# Patient Record
Sex: Male | Born: 1950
Health system: Southern US, Community
[De-identification: ages and names within clinical notes are randomized; demographics above are authoritative.]

## PROBLEM LIST (undated history)

## (undated) DIAGNOSIS — R739 Hyperglycemia, unspecified: Secondary | ICD-10-CM

## (undated) DIAGNOSIS — Z8614 Personal history of Methicillin resistant Staphylococcus aureus infection: Secondary | ICD-10-CM

## (undated) DIAGNOSIS — R7303 Prediabetes: Secondary | ICD-10-CM

## (undated) DIAGNOSIS — H9190 Unspecified hearing loss, unspecified ear: Secondary | ICD-10-CM

## (undated) DIAGNOSIS — A4902 Methicillin resistant Staphylococcus aureus infection, unspecified site: Secondary | ICD-10-CM

## (undated) DIAGNOSIS — N200 Calculus of kidney: Secondary | ICD-10-CM

## (undated) DIAGNOSIS — G4700 Insomnia, unspecified: Secondary | ICD-10-CM

## (undated) DIAGNOSIS — R319 Hematuria, unspecified: Secondary | ICD-10-CM

## (undated) DIAGNOSIS — J920 Pleural plaque with presence of asbestos: Secondary | ICD-10-CM

## (undated) DIAGNOSIS — H409 Unspecified glaucoma: Secondary | ICD-10-CM

## (undated) DIAGNOSIS — I2510 Atherosclerotic heart disease of native coronary artery without angina pectoris: Secondary | ICD-10-CM

## (undated) DIAGNOSIS — N201 Calculus of ureter: Secondary | ICD-10-CM

## (undated) DIAGNOSIS — I7121 Aneurysm of the ascending aorta, without rupture: Secondary | ICD-10-CM

## (undated) DIAGNOSIS — E789 Disorder of lipoprotein metabolism, unspecified: Secondary | ICD-10-CM

## (undated) DIAGNOSIS — E785 Hyperlipidemia, unspecified: Secondary | ICD-10-CM

## (undated) DIAGNOSIS — L02416 Cutaneous abscess of left lower limb: Secondary | ICD-10-CM

## (undated) DIAGNOSIS — J929 Pleural plaque without asbestos: Secondary | ICD-10-CM

## (undated) HISTORY — DX: Hyperglycemia, unspecified: R73.9

## (undated) HISTORY — DX: Hyperlipidemia, unspecified: E78.5

## (undated) HISTORY — DX: Pleural plaque without asbestos: J92.9

## (undated) HISTORY — DX: Calculus of kidney: N20.0

## (undated) HISTORY — PX: NO PAST SURGERIES: SHX2092

## (undated) HISTORY — DX: Cutaneous abscess of left lower limb: L02.416

## (undated) HISTORY — DX: Prediabetes: R73.03

## (undated) HISTORY — DX: Hematuria, unspecified: R31.9

## (undated) HISTORY — DX: Atherosclerotic heart disease of native coronary artery without angina pectoris: I25.10

## (undated) HISTORY — DX: Insomnia, unspecified: G47.00

## (undated) HISTORY — PX: COLONOSCOPY: SHX174

## (undated) HISTORY — DX: Disorder of lipoprotein metabolism, unspecified: E78.9

---

## 2001-10-14 ENCOUNTER — Ambulatory Visit: Admission: RE | Admit: 2001-10-14 | Discharge: 2001-10-14 | Payer: Self-pay | Admitting: Endocrinology

## 2003-09-04 ENCOUNTER — Encounter: Admission: RE | Admit: 2003-09-04 | Discharge: 2003-09-04 | Payer: Self-pay | Admitting: Endocrinology

## 2008-08-03 ENCOUNTER — Ambulatory Visit (HOSPITAL_COMMUNITY): Admission: RE | Admit: 2008-08-03 | Discharge: 2008-08-03 | Payer: Self-pay | Admitting: *Deleted

## 2011-02-17 NOTE — Op Note (Signed)
Kent Hartman, Kent Hartman                ACCOUNT NO.:  [id number]   MEDICAL RECORD NO.:  [id number]          PATIENT TYPE:  AMB   LOCATION:  ENDO                         FACILITY:  [facility]   PHYSICIAN:  Georgiana Spinner, M.D.    DATE OF BIRTH:  05/17/1951   DATE OF PROCEDURE:  DATE OF DISCHARGE:                               OPERATIVE REPORT   PROCEDURE:  Colonoscopy.   INDICATIONS:  Colon cancer screening.   ANESTHESIA:  Fentanyl 100 mcg and Versed 10 mg.   PROCEDURE:  With the patient mildly sedated in the left lateral  decubitus position, a rectal examination was attempted.  Subsequently,  the Pentax videoscopic colonoscope was inserted in the rectum and passed  under direct vision with pressure applied to reach the cecum, identified  by base of cecum and ileocecal valve.  In the cecum, there was a  questionable AVM versus scope trauma that was photographed only.  From  this point the colonoscope was slowly withdrawn, taking circumferential  views of colonic mucosa, stopping only in the rectum which appeared  normal on direct and showed hemorrhoids on retroflexed view.  The  endoscope was straightened and withdrawn.  [name] vital signs and  pulse oximeter remained stable.  The patient tolerated procedure well  without apparent complications.   FINDINGS:  Internal hemorrhoids.  Question of AVM in the cecum versus  scope trauma.   PLAN:  Have patient follow up with me as needed, possibly repeat  examination in 5-10 years.           ______________________________  Georgiana Spinner, M.D.     GMO/MEDQ  D:  08/03/2008  T:  08/03/2008  Job:  [id number]

## 2013-10-24 ENCOUNTER — Emergency Department (HOSPITAL_COMMUNITY): Payer: No Typology Code available for payment source

## 2013-10-24 ENCOUNTER — Encounter (HOSPITAL_COMMUNITY): Payer: Self-pay | Admitting: Emergency Medicine

## 2013-10-24 ENCOUNTER — Emergency Department (HOSPITAL_COMMUNITY)
Admission: EM | Admit: 2013-10-24 | Discharge: 2013-10-24 | Disposition: A | Discharge status: Home / Self Care | Payer: No Typology Code available for payment source | Attending: Emergency Medicine | Admitting: Emergency Medicine

## 2013-10-24 DIAGNOSIS — N200 Calculus of kidney: Secondary | ICD-10-CM | POA: Insufficient documentation

## 2013-10-24 DIAGNOSIS — R109 Unspecified abdominal pain: Secondary | ICD-10-CM

## 2013-10-24 DIAGNOSIS — Z79899 Other long term (current) drug therapy: Secondary | ICD-10-CM | POA: Insufficient documentation

## 2013-10-24 DIAGNOSIS — Z7982 Long term (current) use of aspirin: Secondary | ICD-10-CM | POA: Insufficient documentation

## 2013-10-24 DIAGNOSIS — M5136 Other intervertebral disc degeneration, lumbar region: Secondary | ICD-10-CM

## 2013-10-24 DIAGNOSIS — M5126 Other intervertebral disc displacement, lumbar region: Secondary | ICD-10-CM | POA: Insufficient documentation

## 2013-10-24 LAB — HEPATIC FUNCTION PANEL
ALT: 28 U/L (ref 0–53)
AST: 28 U/L (ref 0–37)
Albumin: 3.5 g/dL (ref 3.5–5.2)
Alkaline Phosphatase: 77 U/L (ref 39–117)
Bilirubin, Direct: <0.2 mg/dL (ref 0.0–0.3)
Total Bilirubin: 0.8 mg/dL (ref 0.3–1.2)
Total Protein: 6.4 g/dL (ref 6.0–8.3)

## 2013-10-24 LAB — URINALYSIS, ROUTINE W REFLEX MICROSCOPIC
Bilirubin Urine: NEGATIVE
Glucose, UA: NEGATIVE mg/dL
Ketones, ur: NEGATIVE mg/dL
Leukocytes, UA: NEGATIVE
Nitrite: NEGATIVE
Protein, ur: NEGATIVE mg/dL
Specific Gravity, Urine: 1.021 (ref 1.005–1.030)
Urobilinogen, UA: 1 mg/dL (ref 0.0–1.0)
pH: 6 (ref 5.0–8.0)

## 2013-10-24 LAB — CBC WITH DIFFERENTIAL/PLATELET
Basophils Absolute: 0 10*3/uL (ref 0.0–0.1)
Basophils Relative: 0 % (ref 0–1)
Eosinophils Absolute: 0.3 10*3/uL (ref 0.0–0.7)
Eosinophils Relative: 3 % (ref 0–5)
HCT: 43.4 % (ref 39.0–52.0)
Hemoglobin: 14.9 g/dL (ref 13.0–17.0)
Lymphocytes Relative: 17 % (ref 12–46)
Lymphs Abs: 1.7 10*3/uL (ref 0.7–4.0)
MCH: 31.6 pg (ref 26.0–34.0)
MCHC: 34.3 g/dL (ref 30.0–36.0)
MCV: 92.1 fL (ref 78.0–100.0)
Monocytes Absolute: 1.1 10*3/uL — ABNORMAL HIGH (ref 0.1–1.0)
Monocytes Relative: 11 % (ref 3–12)
Neutro Abs: 6.6 10*3/uL (ref 1.7–7.7)
Neutrophils Relative %: 68 % (ref 43–77)
Platelets: 233 10*3/uL (ref 150–400)
RBC: 4.71 MIL/uL (ref 4.22–5.81)
RDW: 13.4 % (ref 11.5–15.5)
WBC: 9.8 10*3/uL (ref 4.0–10.5)

## 2013-10-24 LAB — URINE MICROSCOPIC-ADD ON

## 2013-10-24 LAB — POCT I-STAT, CHEM 8
BUN: 22 mg/dL (ref 6–23)
Calcium, Ion: 1.16 mmol/L (ref 1.13–1.30)
Chloride: 101 mEq/L (ref 96–112)
Creatinine, Ser: 1.1 mg/dL (ref 0.50–1.35)
Glucose, Bld: 173 mg/dL — ABNORMAL HIGH (ref 70–99)
HCT: 46 % (ref 39.0–52.0)
Hemoglobin: 15.6 g/dL (ref 13.0–17.0)
Potassium: 3.7 mEq/L (ref 3.7–5.3)
Sodium: 138 mEq/L (ref 137–147)
TCO2: 24 mmol/L (ref 0–100)

## 2013-10-24 MED ORDER — HYDROCODONE-ACETAMINOPHEN 5-325 MG PO TABS: 2.0000 | ORAL_TABLET | ORAL | Status: DC | PRN

## 2013-10-24 NOTE — ED Notes (Signed)
Pt has possible kidney stone, Pt said he was in 10/10 pain earlier but now the pain is 1/10

## 2013-10-24 NOTE — Discharge Instructions (Signed)
If you were given medicines take as directed.  If you are on coumadin or contraceptives realize their levels and effectiveness is altered by many different medicines.  If you have any reaction (rash, tongues swelling, other) to the medicines stop taking and see a physician.   Please follow up as directed and return to the ER or see a physician for new or worsening symptoms.  Thank you. For severe pain take norco or vicodin however realize they have the potential for addiction and it can make you sleepy and has tylenol in it.  No operating machinery while taking.

## 2013-10-24 NOTE — ED Notes (Signed)
Pt reports RT Flank pain woke him up to night. Pain level 10/10 .

## 2013-10-24 NOTE — ED Provider Notes (Signed)
CSN: [id number]     Arrival date & time 10/24/13  0113 History   First MD Initiated Contact with Patient 10/24/13 0149     Chief Complaint  Patient presents with  . Flank Pain   (Consider location/radiation/quality/duration/timing/severity/associated sxs/prior Treatment) HPI Comments: 63 yo male with no medical or surgery hx presents after acute right flank pain/ RUQ pain that woke him from sleep, no hx of similar.  NO known gallstones or kidney stones.  Pain lasted one hr and resolved mostly on arrival.  Radiated to back.  No AAA hx.  No vomiting.  No cp. IMproved with time.   Patient is a 63 y.o. male presenting with flank pain. The history is provided by the patient.  Flank Pain This is a new problem. Associated symptoms include abdominal pain. Pertinent negatives include no chest pain, no headaches and no shortness of breath.    History reviewed. No pertinent past medical history. History reviewed. No pertinent past surgical history. History reviewed. No pertinent family history. History  Substance Use Topics  . Smoking status: Not on file  . Smokeless tobacco: Not on file  . Alcohol Use: Not on file    Review of Systems  Constitutional: Negative for fever and chills.  HENT: Negative for congestion.   Eyes: Negative for visual disturbance.  Respiratory: Negative for shortness of breath.   Cardiovascular: Negative for chest pain.  Gastrointestinal: Positive for abdominal pain. Negative for vomiting.  Genitourinary: Positive for flank pain. Negative for dysuria.  Musculoskeletal: Negative for back pain, neck pain and neck stiffness.  Skin: Negative for rash.  Neurological: Negative for light-headedness and headaches.    Allergies  Review of patient's allergies indicates no known allergies.  Home Medications   Current Outpatient Rx  Name  Route  Sig  Dispense  Refill  . aspirin EC 81 MG tablet   Oral   Take 81 mg by mouth every morning.         . diphenhydrAMINE  (BENADRYL) 25 MG tablet   Oral   Take 500 mg by mouth at bedtime as needed for sleep.         . Multiple Vitamins-Minerals (CENTRUM SILVER PO)   Oral   Take 1 tablet by mouth daily.         . pravastatin (PRAVACHOL) 80 MG tablet   Oral   Take 80 mg by mouth daily.         Marland Kitchen PRESCRIPTION MEDICATION   Oral   Take 1 spray by mouth daily. Nasal spray         . pseudoephedrine-guaifenesin (MUCINEX D) 60-600 MG per tablet   Oral   Take 1 tablet by mouth every 12 (twelve) hours.         . vitamin C (ASCORBIC ACID) 500 MG tablet   Oral   Take 500 mg by mouth daily.         Marland Kitchen HYDROcodone-acetaminophen (NORCO) 5-325 MG per tablet   Oral   Take 2 tablets by mouth every 4 (four) hours as needed.   6 tablet   0    BP 98/68  Pulse 91  Temp(Src) 97.5 F (36.4 C) (Oral)  Resp 20  Ht 6' (1.829 m)  Wt 243 lb (110.224 kg)  BMI 32.95 kg/m2  SpO2 100% Physical Exam  Nursing note and vitals reviewed. Constitutional: He is oriented to person, place, and time. He appears well-developed and well-nourished.  HENT:  Head: Normocephalic and atraumatic.  Eyes: Conjunctivae are normal.  Right eye exhibits no discharge. Left eye exhibits no discharge.  Neck: Normal range of motion. Neck supple. No tracheal deviation present.  Cardiovascular: Normal rate and regular rhythm.   Pulmonary/Chest: Effort normal and breath sounds normal.  Abdominal: Soft. He exhibits no distension. There is tenderness (very mild right upper flank). There is no guarding.  Musculoskeletal: He exhibits no edema.  Neurological: He is alert and oriented to person, place, and time.  Skin: Skin is warm. No rash noted.  Psychiatric: He has a normal mood and affect.    ED Course  Procedures (including critical care time)  EMERGENCY DEPARTMENT BILIARY ULTRASOUND INTERPRETATION "Study: Limited Abdominal Ultrasound of the gallbladder and common bile duct."  INDICATIONS: RUQ pain and Back pain Indication:  Multiple views of the gallbladder and common bile duct were obtained in real-time with a Multi-frequency probe." PERFORMED BY:  Myself IMAGES ARCHIVED?: Yes FINDINGS: Gallstones absent, Gallbladder wall normal in thickness and Sonographic Murphy's sign absent LIMITATIONS: Bowel Gas INTERPRETATION: Normal  Emergency Focused Ultrasound Exam Limited retroperitoneal ultrasound of kidneys  Performed and interpreted by Dr. Reather Converse Indication: flank pain right Focused abdominal ultrasound with both kidneys imaged in transverse and longitudinal planes in real-time. Interpretation: No hydronephrosis visualized.  No stones or cysts visualized  Images archived electronically EMERGENCY DEPARTMENT ULTRASOUND  Study: Limited Retroperitoneal Ultrasound of the Abdominal Aorta.  INDICATIONS:Abdominal pain, Back pain and Age>55 Multiple views of the abdominal aorta were obtained in real-time from the diaphragmatic hiatus to the aortic bifurcation in transverse planes with a multi-frequency probe. PERFORMED BY: Myself IMAGES ARCHIVED?: Yes FINDINGS: Free fluid absent max 2.2 cm LIMITATIONS:  Bowel gas INTERPRETATION:  No abdominal aortic aneurysm    Labs Review Labs Reviewed  URINALYSIS, ROUTINE W REFLEX MICROSCOPIC - Abnormal; Notable for the following:    Hgb urine dipstick SMALL (*)    All other components within normal limits  CBC WITH DIFFERENTIAL - Abnormal; Notable for the following:    Monocytes Absolute 1.1 (*)    All other components within normal limits  POCT I-STAT, CHEM 8 - Abnormal; Notable for the following:    Glucose, Bld 173 (*)    All other components within normal limits  URINE MICROSCOPIC-ADD ON  HEPATIC FUNCTION PANEL   Imaging Review Ct Abdomen Pelvis Wo Contrast  10/24/2013   CLINICAL DATA:  Right flank pain.  EXAM: CT ABDOMEN AND PELVIS WITHOUT CONTRAST  TECHNIQUE: Multidetector CT imaging of the abdomen and pelvis was performed following the standard protocol without  intravenous contrast.  COMPARISON:  None available for comparison at time of study interpretation.  FINDINGS: Included view of the lung bases demonstrates subsegmental left lower lobe atelectasis and bibasilar calcified granulomas. 13 mm nodule along the inferior left diaphragmatic surface, coronal 39/93. The visualized heart and pericardium are unremarkable.  Kidneys are orthotopic, demonstrating normal size and morphology. 2 mm right upper pole nonobstructing nephrolithiasis. Minimal right hydroureteronephrosis ; limited assessment for renal masses on this nonenhanced examination. The unopacified ureters are normal in course and caliber. 2 mm bladder calculus towards the right.  Dystrophic calcification within the right lobe of the liver, in addition to an ill-defined 18 mm hypodensity in segment 7 of the liver, 16/100. Subcentimeter hepatic hypodensities seen. Spleen, gallbladder, pancreas and adrenal glands are unremarkable for this non-contrast examination.  Small hiatal hernia. The stomach, small and large bowel are normal in course and caliber without inflammatory changes, the sensitivity may be decreased by lack of enteric contrast. No intraperitoneal free fluid nor free  air.  Great vessels are normal in course and caliber with mild to moderate calcific atherosclerosis. Partially calcified granuloma in the pelvis. Prostate is enlarged, 6.5 cm in transaxial dimension, deforming the base the urinary bladder. Moderate right fat containing inguinal hernia. Suspected L3-4 disc extrusion resulting in mild canal stenosis, and may be chronic.  IMPRESSION: Minimal right hydroureteronephrosis with 2 mm bladder calculus towards the right suggesting recently passed stone. Residual 2 mm right upper pole nonobstructing nephrolithiasis.  18 mm indeterminate right hepatic lesion, recommend six-month follow-up CT or MRI. In addition, 13 mm nodule along the inferior left diaphragm, recommend close attention to this area on  the follow-up examination.  Prostatomegaly.  Suspected L3-4 disc extrusion may be chronic and would be better characterized on MRI of the lumbar spine as clinically indicated.   Electronically Signed   By: Elon Alas   On: 10/24/2013 03:21    EKG Interpretation   None       MDM   1. Bulge of lumbar disc without myelopathy   2. Right flank pain   3. Right kidney stone    Acute flank pain in older gentleman. Bedside US done, no hydronephrosis, no gallstones, nl aorta. With acute onset and hematuria concern for first time stone. CT confirmed. No pain on recheck.  Results and differential diagnosis were discussed with the patient. Close follow up outpatient was discussed, patient comfortable with the plan.   Diagnosis: above    Mariea Clonts, MD 10/24/13 [phone]

## 2015-06-21 ENCOUNTER — Encounter (HOSPITAL_COMMUNITY): Payer: Self-pay | Admitting: Emergency Medicine

## 2015-06-21 ENCOUNTER — Emergency Department (HOSPITAL_COMMUNITY)
Admission: EM | Admit: 2015-06-21 | Discharge: 2015-06-21 | Disposition: A | Discharge status: Home / Self Care | Payer: No Typology Code available for payment source | Attending: Emergency Medicine | Admitting: Emergency Medicine

## 2015-06-21 DIAGNOSIS — H9190 Unspecified hearing loss, unspecified ear: Secondary | ICD-10-CM | POA: Insufficient documentation

## 2015-06-21 DIAGNOSIS — L03116 Cellulitis of left lower limb: Secondary | ICD-10-CM | POA: Diagnosis not present

## 2015-06-21 DIAGNOSIS — Z8614 Personal history of Methicillin resistant Staphylococcus aureus infection: Secondary | ICD-10-CM | POA: Insufficient documentation

## 2015-06-21 DIAGNOSIS — Y9389 Activity, other specified: Secondary | ICD-10-CM | POA: Diagnosis not present

## 2015-06-21 DIAGNOSIS — Z87442 Personal history of urinary calculi: Secondary | ICD-10-CM | POA: Diagnosis not present

## 2015-06-21 DIAGNOSIS — S90562A Insect bite (nonvenomous), left ankle, initial encounter: Secondary | ICD-10-CM | POA: Diagnosis present

## 2015-06-21 DIAGNOSIS — Z79899 Other long term (current) drug therapy: Secondary | ICD-10-CM | POA: Insufficient documentation

## 2015-06-21 DIAGNOSIS — Z7982 Long term (current) use of aspirin: Secondary | ICD-10-CM | POA: Diagnosis not present

## 2015-06-21 DIAGNOSIS — L02419 Cutaneous abscess of limb, unspecified: Secondary | ICD-10-CM

## 2015-06-21 DIAGNOSIS — Y998 Other external cause status: Secondary | ICD-10-CM | POA: Diagnosis not present

## 2015-06-21 DIAGNOSIS — E785 Hyperlipidemia, unspecified: Secondary | ICD-10-CM | POA: Diagnosis not present

## 2015-06-21 DIAGNOSIS — Y92096 Garden or yard of other non-institutional residence as the place of occurrence of the external cause: Secondary | ICD-10-CM | POA: Insufficient documentation

## 2015-06-21 DIAGNOSIS — L02416 Cutaneous abscess of left lower limb: Secondary | ICD-10-CM | POA: Insufficient documentation

## 2015-06-21 DIAGNOSIS — Z872 Personal history of diseases of the skin and subcutaneous tissue: Secondary | ICD-10-CM

## 2015-06-21 DIAGNOSIS — W57XXXA Bitten or stung by nonvenomous insect and other nonvenomous arthropods, initial encounter: Secondary | ICD-10-CM | POA: Insufficient documentation

## 2015-06-21 HISTORY — DX: Calculus of kidney: N20.0

## 2015-06-21 HISTORY — DX: Personal history of diseases of the skin and subcutaneous tissue: Z87.2

## 2015-06-21 HISTORY — DX: Unspecified hearing loss, unspecified ear: H91.90

## 2015-06-21 HISTORY — DX: Methicillin resistant Staphylococcus aureus infection, unspecified site: A49.02

## 2015-06-21 MED ORDER — CEPHALEXIN 500 MG PO CAPS: 500.0000 mg | ORAL_CAPSULE | Freq: Two times a day (BID) | ORAL | Status: DC

## 2015-06-21 MED ORDER — LIDOCAINE-EPINEPHRINE 1 %-1:100000 IJ SOLN
10.0000 mL | Freq: Once | INTRAMUSCULAR | Status: AC
  Administered 2015-06-21: 10 mL
  Filled 2015-06-21: qty 1

## 2015-06-21 NOTE — Discharge Instructions (Signed)

## 2015-06-21 NOTE — ED Provider Notes (Signed)
CSN: [id number]     Arrival date & time 06/21/15  0935 History   First MD Initiated Contact with Patient 06/21/15 0940     Chief Complaint  Patient presents with  . Insect Bite  . Leg Swelling     (Consider location/radiation/quality/duration/timing/severity/associated sxs/prior Treatment) HPI Comments: 64 year old male with history of hyperlipidemia who presents with insect bite. The patient states that one week ago, he had some sort of insect sting on his left ankle while he was mowing his lawn. It initially didn't bother him but several days later he had gone hiking in his shoe rubbed the area and he noticed some increased swelling and pain around the sting site. The swelling and redness has worsened. Pain is mild at rest and worsens when he stands up but after several steps he is able to ambulate without difficulty. He denies any pain with moving his ankle. He went to an urgent care last night, where he was given a dose of ceftriaxone and discharged with a prescription of Bactrim. They instructed him to follow-up here today for reevaluation. Patient denies any fevers, vomiting, or systemic illness.  The history is provided by the patient.    Past Medical History  Diagnosis Date  . MRSA (methicillin resistant Staphylococcus aureus)   . Hard of hearing   . Kidney stone    History reviewed. No pertinent past surgical history. No family history on file. Social History  Substance Use Topics  . Smoking status: Never Smoker   . Smokeless tobacco: None  . Alcohol Use: No    Review of Systems  10 Systems reviewed and are negative for acute change except as noted in the HPI.   Allergies  Review of patient's allergies indicates no known allergies.  Home Medications   Prior to Admission medications   Medication Sig Start Date End Date Taking? Authorizing Provider  aspirin EC 81 MG tablet Take 81 mg by mouth every morning.    Historical Provider, MD  diphenhydrAMINE (BENADRYL) 25 MG  tablet Take 500 mg by mouth at bedtime as needed for sleep.    Historical Provider, MD  HYDROcodone-acetaminophen (NORCO) 5-325 MG per tablet Take 2 tablets by mouth every 4 (four) hours as needed. 10/24/13   Elnora Morrison, MD  Multiple Vitamins-Minerals (CENTRUM SILVER PO) Take 1 tablet by mouth daily.    Historical Provider, MD  pravastatin (PRAVACHOL) 80 MG tablet Take 80 mg by mouth daily.    Historical Provider, MD  PRESCRIPTION MEDICATION Take 1 spray by mouth daily. Nasal spray    Historical Provider, MD  pseudoephedrine-guaifenesin (MUCINEX D) 60-600 MG per tablet Take 1 tablet by mouth every 12 (twelve) hours.    Historical Provider, MD  vitamin C (ASCORBIC ACID) 500 MG tablet Take 500 mg by mouth daily.    Historical Provider, MD   BP 134/87 mmHg  Pulse 87  Temp(Src) 98.4 F (36.9 C) (Oral)  Resp 18  Ht 6' (1.829 m)  Wt 243 lb (110.224 kg)  BMI 32.95 kg/m2  SpO2 96% Physical Exam  Constitutional: He is oriented to person, place, and time. He appears well-developed and well-nourished. No distress.  HENT:  Head: Normocephalic and atraumatic.  Nose: Nose normal.  Musculoskeletal:  Erythema and central swelling w/ induration, skin sloughing and drainage on anterior distal L ankle near dorsum of foot, normal ROM L ankle; 2+ DP pulses  Neurological: He is alert and oriented to person, place, and time.  Normal sensation b/l feet  Skin:  3cm  area of induration, swelling, and skin sloughing w/ drainage on distal anterior ankle near dorsum of foot   Psychiatric: He has a normal mood and affect. Judgment normal.    ED Course  INCISION AND DRAINAGE Date/Time: 06/21/2015 11:03 AM Performed by: Sharlett Iles Authorized by: Sharlett Iles Consent: Verbal consent obtained. Risks and benefits: risks, benefits and alternatives were discussed Consent given by: patient Patient understanding: patient states understanding of the procedure being performed Patient consent: the  patient's understanding of the procedure matches consent given Procedure consent: procedure consent matches procedure scheduled Patient identity confirmed: verbally with patient Type: abscess Body area: lower extremity Location details: left leg Anesthesia: local infiltration Local anesthetic: lidocaine 1% with epinephrine Anesthetic total: 2.5 ml Patient sedated: no Scalpel size: 11 Incision type: single straight Incision depth: dermal Complexity: simple Drainage: purulent Wound treatment: wound left open Packing material: 1/2 in gauze Patient tolerance: Patient tolerated the procedure well with no immediate complications   (including critical care time) Labs Review Labs Reviewed - No data to display    MDM   Final diagnoses:  Abscess of leg  cellulitis  64 year old male who presents with insect sting one week ago that later developed erythema, swelling, and recently drainage. Patient has history of MRSA infections requiring drainage. He was given ceftriaxone and started on Bactrim last night at an urgent care facility. Patient well-appearing with normal vital signs at presentation. Exam concerning for cellulitis versus abscess on anterior, distal left ankle near dorsum of foot. The patient denies any pain with movement of ankle and has normal range of motion. The skin infection appears superficial to the ankle joint. Bedside ultrasound shows fluid collection concerning for abscess. Performed incision and drainage of abscess at bedside; see procedure note for details. Small amount of pus drained. Pt tolerated procedure well. Patient has no comorbidities such as immunosuppression or diabetes thus I feel he is appropriate for outpatient treatment of cellulitis. The patient already has a prescription for Bactrim and given his history of MRSA, gave him prescription for Keflex and instructed to take both medications as prescribed until finished. I extensively reviewed return precautions  including worsening redness, worsening pain, fever, or any other new or alarming symptoms. The patient voiced understanding. I've instructed to follow-up with PCP in 3 days or here if symptoms worsen. Patient discharged in satisfactory condition.  Sharlett Iles, MD 06/21/15 [phone]

## 2015-06-21 NOTE — ED Notes (Signed)
Pt c/o was out working in the yard when he felt what was like a bee sting to his left ankle area. Pt has been using antibiotic ointment on it and cover with bandaid. Area has become worse. Pt seen at urgent care in [place] given antibiotic shot and prescriptions. Pt was told to return today for wound check, when they called the Dr to get an appointment they were told to come to ED. Area red and swollen with drainage.

## 2015-11-26 DIAGNOSIS — Z0000 Encounter for general adult medical examination without abnormal findings: Secondary | ICD-10-CM | POA: Diagnosis not present

## 2015-11-26 DIAGNOSIS — E789 Disorder of lipoprotein metabolism, unspecified: Secondary | ICD-10-CM | POA: Diagnosis not present

## 2015-11-26 DIAGNOSIS — R739 Hyperglycemia, unspecified: Secondary | ICD-10-CM | POA: Diagnosis not present

## 2015-11-26 DIAGNOSIS — Z125 Encounter for screening for malignant neoplasm of prostate: Secondary | ICD-10-CM | POA: Diagnosis not present

## 2015-12-03 DIAGNOSIS — E789 Disorder of lipoprotein metabolism, unspecified: Secondary | ICD-10-CM | POA: Diagnosis not present

## 2015-12-03 DIAGNOSIS — Z23 Encounter for immunization: Secondary | ICD-10-CM | POA: Diagnosis not present

## 2015-12-03 DIAGNOSIS — R899 Unspecified abnormal finding in specimens from other organs, systems and tissues: Secondary | ICD-10-CM | POA: Diagnosis not present

## 2016-05-25 DIAGNOSIS — R739 Hyperglycemia, unspecified: Secondary | ICD-10-CM | POA: Diagnosis not present

## 2016-05-25 DIAGNOSIS — E789 Disorder of lipoprotein metabolism, unspecified: Secondary | ICD-10-CM | POA: Diagnosis not present

## 2016-06-09 DIAGNOSIS — R7309 Other abnormal glucose: Secondary | ICD-10-CM | POA: Diagnosis not present

## 2016-06-09 DIAGNOSIS — E789 Disorder of lipoprotein metabolism, unspecified: Secondary | ICD-10-CM | POA: Diagnosis not present

## 2016-06-14 ENCOUNTER — Emergency Department (HOSPITAL_COMMUNITY)
Admission: EM | Admit: 2016-06-14 | Discharge: 2016-06-14 | Disposition: A | Discharge status: Home / Self Care | Payer: PPO | Attending: Emergency Medicine | Admitting: Emergency Medicine

## 2016-06-14 ENCOUNTER — Encounter (HOSPITAL_COMMUNITY): Payer: Self-pay

## 2016-06-14 DIAGNOSIS — H00035 Abscess of left lower eyelid: Secondary | ICD-10-CM | POA: Diagnosis not present

## 2016-06-14 DIAGNOSIS — Z7982 Long term (current) use of aspirin: Secondary | ICD-10-CM | POA: Insufficient documentation

## 2016-06-14 DIAGNOSIS — L0201 Cutaneous abscess of face: Secondary | ICD-10-CM | POA: Diagnosis not present

## 2016-06-14 DIAGNOSIS — R600 Localized edema: Secondary | ICD-10-CM | POA: Diagnosis not present

## 2016-06-14 LAB — CBG MONITORING, ED: Glucose-Capillary: 120 mg/dL — ABNORMAL HIGH (ref 65–99)

## 2016-06-14 LAB — BASIC METABOLIC PANEL
Anion gap: 6 (ref 5–15)
BUN: 18 mg/dL (ref 6–20)
CO2: 26 mmol/L (ref 22–32)
Calcium: 8.8 mg/dL — ABNORMAL LOW (ref 8.9–10.3)
Chloride: 108 mmol/L (ref 101–111)
Creatinine, Ser: 1.39 mg/dL — ABNORMAL HIGH (ref 0.61–1.24)
GFR calc Af Amer: 60 mL/min — ABNORMAL LOW (ref >60)
GFR calc non Af Amer: 52 mL/min — ABNORMAL LOW (ref >60)
Glucose, Bld: 107 mg/dL — ABNORMAL HIGH (ref 65–99)
Potassium: 4.3 mmol/L (ref 3.5–5.1)
Sodium: 140 mmol/L (ref 135–145)

## 2016-06-14 LAB — CBC WITH DIFFERENTIAL/PLATELET
Basophils Absolute: 0.1 10*3/uL (ref 0.0–0.1)
Basophils Relative: 1 %
Eosinophils Absolute: 0.2 10*3/uL (ref 0.0–0.7)
Eosinophils Relative: 2 %
HCT: 44.5 % (ref 39.0–52.0)
Hemoglobin: 14.9 g/dL (ref 13.0–17.0)
Lymphocytes Relative: 17 %
Lymphs Abs: 1.4 10*3/uL (ref 0.7–4.0)
MCH: 30.8 pg (ref 26.0–34.0)
MCHC: 33.5 g/dL (ref 30.0–36.0)
MCV: 92.1 fL (ref 78.0–100.0)
Monocytes Absolute: 0.9 10*3/uL (ref 0.1–1.0)
Monocytes Relative: 11 %
Neutro Abs: 5.8 10*3/uL (ref 1.7–7.7)
Neutrophils Relative %: 69 %
Platelets: 168 10*3/uL (ref 150–400)
RBC: 4.83 MIL/uL (ref 4.22–5.81)
RDW: 13.6 % (ref 11.5–15.5)
WBC: 8.4 10*3/uL (ref 4.0–10.5)

## 2016-06-14 MED ORDER — DOXYCYCLINE HYCLATE 100 MG PO CAPS: 100.0000 mg | ORAL_CAPSULE | Freq: Two times a day (BID) | ORAL | 0 refills | Status: DC

## 2016-06-14 MED ORDER — AMOXICILLIN 500 MG PO CAPS: 500.0000 mg | ORAL_CAPSULE | Freq: Three times a day (TID) | ORAL | 0 refills | Status: DC

## 2016-06-14 MED ORDER — LIDOCAINE-EPINEPHRINE (PF) 2 %-1:200000 IJ SOLN
10.0000 mL | Freq: Once | INTRAMUSCULAR | Status: AC
  Administered 2016-06-14: 10 mL
  Filled 2016-06-14: qty 20

## 2016-06-14 MED ORDER — DOXYCYCLINE HYCLATE 100 MG PO TABS
100.0000 mg | ORAL_TABLET | Freq: Once | ORAL | Status: AC
  Administered 2016-06-14: 100 mg via ORAL
  Filled 2016-06-14: qty 1

## 2016-06-14 MED ORDER — AMOXICILLIN 500 MG PO CAPS
500.0000 mg | ORAL_CAPSULE | Freq: Once | ORAL | Status: AC
  Administered 2016-06-14: 500 mg via ORAL
  Filled 2016-06-14: qty 1

## 2016-06-14 NOTE — ED Triage Notes (Signed)
Patient complains of ongoing left eye redness, swelling and drainage x 6 days. Today bloody drainage noted from same. States that it started with itching and thinks he may have gotten saw dust in same last Monday. Sent from urgent care for further evaluation

## 2016-06-14 NOTE — Discharge Instructions (Signed)
It was our pleasure to provide your ER care today - we hope that you feel better.  Warm compresses to sore area.  Take antibiotics as prescribed.  Take tylenol/advil as need.  Follow up with primary care doctor in 1-2 days for recheck.  Return to ER if worse, new symptoms, spreading redness, increased swelling, severe pain, eye pain or change in vision, other concern.

## 2016-06-14 NOTE — ED Provider Notes (Signed)
[place] DEPT Provider Note   CSN: [id number] Arrival date & time: 06/14/16  1429     History   Chief Complaint Chief Complaint  Patient presents with  . Eye Problem  . Facial Swelling    HPI Kent Hartman is a 65 y.o. male.  Patient c/o left lower eyelid pain and swelling for the past week.  Started as a very small itchy area. Denies specific fb sensation into eye or eye injury.  Since then has slowly gotten progressively worse, w increased swelling and redness. Intermittently it has drained a small amount, especially when holding warm compresses. Denies hx same. Denies injury. No eye pain or change in vision. Remote hx mrsa in other areas of body/localized abscess. Denies fever or chills. Does not feel sick or ill.    The history is provided by the patient.  Eye Problem      Past Medical History:  Diagnosis Date  . Hard of hearing   . Kidney stone   . MRSA (methicillin resistant Staphylococcus aureus)     There are no active problems to display for this patient.   History reviewed. No pertinent surgical history.     Home Medications    Prior to Admission medications   Medication Sig Start Date End Date Taking? Authorizing Provider  aspirin EC 81 MG tablet Take 81 mg by mouth every morning.    Historical Provider, MD  cephALEXin (KEFLEX) 500 MG capsule Take 1 capsule (500 mg total) by mouth 2 (two) times daily. 06/21/15   Sharlett Iles, MD  diphenhydrAMINE (BENADRYL) 25 MG tablet Take 500 mg by mouth at bedtime as needed for sleep.    Historical Provider, MD  HYDROcodone-acetaminophen (NORCO) 5-325 MG per tablet Take 2 tablets by mouth every 4 (four) hours as needed. 10/24/13   Elnora Morrison, MD  Multiple Vitamins-Minerals (CENTRUM SILVER PO) Take 1 tablet by mouth daily.    Historical Provider, MD  pravastatin (PRAVACHOL) 80 MG tablet Take 80 mg by mouth daily.    Historical Provider, MD  PRESCRIPTION MEDICATION Take 1 spray by mouth daily. Nasal  spray    Historical Provider, MD  pseudoephedrine-guaifenesin (MUCINEX D) 60-600 MG per tablet Take 1 tablet by mouth every 12 (twelve) hours.    Historical Provider, MD  vitamin C (ASCORBIC ACID) 500 MG tablet Take 500 mg by mouth daily.    Historical Provider, MD    Family History No family history on file.  Social History Social History  Substance Use Topics  . Smoking status: Never Smoker  . Smokeless tobacco: Never Used  . Alcohol use No     Allergies   Review of patient's allergies indicates no known allergies.   Review of Systems Review of Systems  Constitutional: Negative for chills and fever.  Eyes: Negative for pain and visual disturbance.  Neurological: Negative for headaches.     Physical Exam Updated Vital Signs BP 124/100 (BP Location: Right Arm)   Pulse 98   Temp 97.8 F (36.6 C) (Oral)   SpO2 100%   Physical Exam  Constitutional: He appears well-developed and well-nourished. No distress.  HENT:  Swelling, erythema and tenderness medial half of left lower lid, extending inferiorly.  Conj normal. No pain w eom.   Eyes: Conjunctivae and EOM are normal. Pupils are equal, round, and reactive to light.  Lids everted, no fb.   Neck: Neck supple. No tracheal deviation present.  Cardiovascular: Normal rate.   Pulmonary/Chest: Effort normal. No accessory muscle usage.  No respiratory distress.  Musculoskeletal: He exhibits no edema.  Lymphadenopathy:    He has no cervical adenopathy.  Neurological: He is alert.  Skin: Skin is warm and dry.  Psychiatric: He has a normal mood and affect.  Nursing note and vitals reviewed.    ED Treatments / Results  Labs (all labs ordered are listed, but only abnormal results are displayed) Results for orders placed or performed during the hospital encounter of 06/14/16  CBC with Differential  Result Value Ref Range   WBC 8.4 4.0 - 10.5 K/uL   RBC 4.83 4.22 - 5.81 MIL/uL   Hemoglobin 14.9 13.0 - 17.0 g/dL   HCT 44.5  39.0 - 52.0 %   MCV 92.1 78.0 - 100.0 fL   MCH 30.8 26.0 - 34.0 pg   MCHC 33.5 30.0 - 36.0 g/dL   RDW 13.6 11.5 - 15.5 %   Platelets 168 150 - 400 K/uL   Neutrophils Relative % 69 %   Neutro Abs 5.8 1.7 - 7.7 K/uL   Lymphocytes Relative 17 %   Lymphs Abs 1.4 0.7 - 4.0 K/uL   Monocytes Relative 11 %   Monocytes Absolute 0.9 0.1 - 1.0 K/uL   Eosinophils Relative 2 %   Eosinophils Absolute 0.2 0.0 - 0.7 K/uL   Basophils Relative 1 %   Basophils Absolute 0.1 0.0 - 0.1 K/uL  Basic metabolic panel  Result Value Ref Range   Sodium 140 135 - 145 mmol/L   Potassium 4.3 3.5 - 5.1 mmol/L   Chloride 108 101 - 111 mmol/L   CO2 26 22 - 32 mmol/L   Glucose, Bld 107 (H) 65 - 99 mg/dL   BUN 18 6 - 20 mg/dL   Creatinine, Ser 1.39 (H) 0.61 - 1.24 mg/dL   Calcium 8.8 (L) 8.9 - 10.3 mg/dL   GFR calc non Af Amer 52 (L) >60 mL/min   GFR calc Af Amer 60 (L) >60 mL/min   Anion gap 6 5 - 15    EKG  EKG Interpretation None       Radiology No results found.  Procedures .Marland KitchenIncision and Drainage Date/Time: 06/14/2016 6:23 PM Performed by: Lajean Saver Authorized by: Lajean Saver   Consent:    Consent obtained:  Verbal   Consent given by:  Patient Location:    Type:  Abscess   Size:  2 cm    Location:  Head   Head location:  Face Pre-procedure details:    Skin preparation:  Betadine Anesthesia (see MAR for exact dosages):    Anesthesia method:  Local infiltration   Local anesthetic:  Lidocaine 2% WITH epi Procedure type:    Complexity:  Complex Procedure details:    Incision types:  Single straight   Scalpel blade:  11   Wound management:  Probed and deloculated   Drainage:  Purulent   Drainage amount:  Moderate   Wound treatment:  Wound left open   Packing materials:  None Post-procedure details:    Patient tolerance of procedure:  Tolerated well, no immediate complications   (including critical care time)  Medications Ordered in ED Medications  lidocaine-EPINEPHrine  (XYLOCAINE W/EPI) 2 %-1:200000 (PF) injection 10 mL (not administered)     Initial Impression / Assessment and Plan / ED Course  I have reviewed the triage vital signs and the nursing notes.  Pertinent labs & imaging results that were available during my care of the patient were reviewed by me and considered in my medical decision making (  see chart for details).  Clinical Course   Area c/w abscess, ?initially related to chalazion, w mild surrounding cellulitis. No eye pain or discomfort, no indication on exam of orbital cellulitis.   Confirmed nkda w pt.  Abscess drained.  Will tx w amox, double cover w doxy given hx mrsa.   Wound check pcp 1-2 days.   Sterile dressing.   Final Clinical Impressions(s) / ED Diagnoses   Final diagnoses:  None    New Prescriptions New Prescriptions   No medications on file     Lajean Saver, MD 06/14/16 1825

## 2016-06-17 DIAGNOSIS — H00036 Abscess of eyelid left eye, unspecified eyelid: Secondary | ICD-10-CM | POA: Diagnosis not present

## 2016-06-17 DIAGNOSIS — Z23 Encounter for immunization: Secondary | ICD-10-CM | POA: Diagnosis not present

## 2016-06-18 DIAGNOSIS — J209 Acute bronchitis, unspecified: Secondary | ICD-10-CM | POA: Diagnosis not present

## 2016-10-06 DIAGNOSIS — Z0000 Encounter for general adult medical examination without abnormal findings: Secondary | ICD-10-CM | POA: Diagnosis not present

## 2016-10-06 DIAGNOSIS — R739 Hyperglycemia, unspecified: Secondary | ICD-10-CM | POA: Diagnosis not present

## 2016-10-06 DIAGNOSIS — E789 Disorder of lipoprotein metabolism, unspecified: Secondary | ICD-10-CM | POA: Diagnosis not present

## 2016-10-13 DIAGNOSIS — M25532 Pain in left wrist: Secondary | ICD-10-CM | POA: Diagnosis not present

## 2016-10-13 DIAGNOSIS — E789 Disorder of lipoprotein metabolism, unspecified: Secondary | ICD-10-CM | POA: Diagnosis not present

## 2016-10-13 DIAGNOSIS — W19XXXA Unspecified fall, initial encounter: Secondary | ICD-10-CM | POA: Diagnosis not present

## 2016-10-13 DIAGNOSIS — M25539 Pain in unspecified wrist: Secondary | ICD-10-CM | POA: Diagnosis not present

## 2016-12-01 DIAGNOSIS — Z0000 Encounter for general adult medical examination without abnormal findings: Secondary | ICD-10-CM | POA: Diagnosis not present

## 2016-12-01 DIAGNOSIS — Z125 Encounter for screening for malignant neoplasm of prostate: Secondary | ICD-10-CM | POA: Diagnosis not present

## 2016-12-01 DIAGNOSIS — R739 Hyperglycemia, unspecified: Secondary | ICD-10-CM | POA: Diagnosis not present

## 2016-12-01 DIAGNOSIS — E789 Disorder of lipoprotein metabolism, unspecified: Secondary | ICD-10-CM | POA: Diagnosis not present

## 2016-12-08 DIAGNOSIS — G4700 Insomnia, unspecified: Secondary | ICD-10-CM | POA: Diagnosis not present

## 2016-12-08 DIAGNOSIS — L0390 Cellulitis, unspecified: Secondary | ICD-10-CM | POA: Diagnosis not present

## 2016-12-08 DIAGNOSIS — E789 Disorder of lipoprotein metabolism, unspecified: Secondary | ICD-10-CM | POA: Diagnosis not present

## 2016-12-10 DIAGNOSIS — L0390 Cellulitis, unspecified: Secondary | ICD-10-CM | POA: Diagnosis not present

## 2017-01-19 DIAGNOSIS — Z1211 Encounter for screening for malignant neoplasm of colon: Secondary | ICD-10-CM | POA: Diagnosis not present

## 2017-01-19 DIAGNOSIS — L290 Pruritus ani: Secondary | ICD-10-CM | POA: Diagnosis not present

## 2017-01-27 DIAGNOSIS — Z1211 Encounter for screening for malignant neoplasm of colon: Secondary | ICD-10-CM | POA: Diagnosis not present

## 2017-03-16 DIAGNOSIS — J853 Abscess of mediastinum: Secondary | ICD-10-CM | POA: Diagnosis not present

## 2017-03-16 DIAGNOSIS — L02211 Cutaneous abscess of abdominal wall: Secondary | ICD-10-CM | POA: Diagnosis not present

## 2017-07-05 DIAGNOSIS — E669 Obesity, unspecified: Secondary | ICD-10-CM | POA: Diagnosis not present

## 2017-07-05 DIAGNOSIS — Z23 Encounter for immunization: Secondary | ICD-10-CM | POA: Diagnosis not present

## 2017-07-05 DIAGNOSIS — R7303 Prediabetes: Secondary | ICD-10-CM | POA: Diagnosis not present

## 2017-07-05 DIAGNOSIS — Z125 Encounter for screening for malignant neoplasm of prostate: Secondary | ICD-10-CM | POA: Diagnosis not present

## 2017-07-05 DIAGNOSIS — Z0000 Encounter for general adult medical examination without abnormal findings: Secondary | ICD-10-CM | POA: Diagnosis not present

## 2017-07-05 DIAGNOSIS — N200 Calculus of kidney: Secondary | ICD-10-CM | POA: Diagnosis not present

## 2017-07-05 DIAGNOSIS — E7800 Pure hypercholesterolemia, unspecified: Secondary | ICD-10-CM | POA: Diagnosis not present

## 2017-10-05 HISTORY — PX: COLONOSCOPY: SHX174

## 2017-12-29 DIAGNOSIS — E789 Disorder of lipoprotein metabolism, unspecified: Secondary | ICD-10-CM | POA: Diagnosis not present

## 2018-01-03 DIAGNOSIS — E669 Obesity, unspecified: Secondary | ICD-10-CM | POA: Diagnosis not present

## 2018-01-03 DIAGNOSIS — E7800 Pure hypercholesterolemia, unspecified: Secondary | ICD-10-CM | POA: Diagnosis not present

## 2018-01-03 DIAGNOSIS — R7303 Prediabetes: Secondary | ICD-10-CM | POA: Diagnosis not present

## 2018-02-01 DIAGNOSIS — L290 Pruritus ani: Secondary | ICD-10-CM | POA: Diagnosis not present

## 2018-02-01 DIAGNOSIS — Z1211 Encounter for screening for malignant neoplasm of colon: Secondary | ICD-10-CM | POA: Diagnosis not present

## 2018-02-02 DIAGNOSIS — Z0184 Encounter for antibody response examination: Secondary | ICD-10-CM | POA: Diagnosis not present

## 2018-03-01 DIAGNOSIS — K621 Rectal polyp: Secondary | ICD-10-CM | POA: Diagnosis not present

## 2018-03-01 DIAGNOSIS — D126 Benign neoplasm of colon, unspecified: Secondary | ICD-10-CM | POA: Diagnosis not present

## 2018-03-01 DIAGNOSIS — Z1211 Encounter for screening for malignant neoplasm of colon: Secondary | ICD-10-CM | POA: Diagnosis not present

## 2018-03-01 DIAGNOSIS — K648 Other hemorrhoids: Secondary | ICD-10-CM | POA: Diagnosis not present

## 2018-03-04 DIAGNOSIS — K621 Rectal polyp: Secondary | ICD-10-CM | POA: Diagnosis not present

## 2018-03-04 DIAGNOSIS — Z1211 Encounter for screening for malignant neoplasm of colon: Secondary | ICD-10-CM | POA: Diagnosis not present

## 2018-03-04 DIAGNOSIS — D126 Benign neoplasm of colon, unspecified: Secondary | ICD-10-CM | POA: Diagnosis not present

## 2018-03-22 DIAGNOSIS — J0100 Acute maxillary sinusitis, unspecified: Secondary | ICD-10-CM | POA: Diagnosis not present

## 2018-03-22 DIAGNOSIS — J209 Acute bronchitis, unspecified: Secondary | ICD-10-CM | POA: Diagnosis not present

## 2018-03-22 DIAGNOSIS — R509 Fever, unspecified: Secondary | ICD-10-CM | POA: Diagnosis not present

## 2018-07-06 DIAGNOSIS — E7800 Pure hypercholesterolemia, unspecified: Secondary | ICD-10-CM | POA: Diagnosis not present

## 2018-07-06 DIAGNOSIS — Z125 Encounter for screening for malignant neoplasm of prostate: Secondary | ICD-10-CM | POA: Diagnosis not present

## 2018-07-11 DIAGNOSIS — Z6833 Body mass index (BMI) 33.0-33.9, adult: Secondary | ICD-10-CM | POA: Diagnosis not present

## 2018-07-11 DIAGNOSIS — Z0000 Encounter for general adult medical examination without abnormal findings: Secondary | ICD-10-CM | POA: Diagnosis not present

## 2018-07-11 DIAGNOSIS — Z7982 Long term (current) use of aspirin: Secondary | ICD-10-CM | POA: Diagnosis not present

## 2018-07-11 DIAGNOSIS — Z23 Encounter for immunization: Secondary | ICD-10-CM | POA: Diagnosis not present

## 2018-07-11 DIAGNOSIS — F5104 Psychophysiologic insomnia: Secondary | ICD-10-CM | POA: Diagnosis not present

## 2018-07-11 DIAGNOSIS — K648 Other hemorrhoids: Secondary | ICD-10-CM | POA: Diagnosis not present

## 2018-07-11 DIAGNOSIS — M5136 Other intervertebral disc degeneration, lumbar region: Secondary | ICD-10-CM | POA: Diagnosis not present

## 2018-07-11 DIAGNOSIS — N200 Calculus of kidney: Secondary | ICD-10-CM | POA: Diagnosis not present

## 2018-07-11 DIAGNOSIS — E789 Disorder of lipoprotein metabolism, unspecified: Secondary | ICD-10-CM | POA: Diagnosis not present

## 2018-07-11 DIAGNOSIS — J929 Pleural plaque without asbestos: Secondary | ICD-10-CM | POA: Diagnosis not present

## 2018-07-11 DIAGNOSIS — R7303 Prediabetes: Secondary | ICD-10-CM | POA: Diagnosis not present

## 2018-07-12 DIAGNOSIS — J929 Pleural plaque without asbestos: Secondary | ICD-10-CM | POA: Diagnosis not present

## 2018-08-23 DIAGNOSIS — R300 Dysuria: Secondary | ICD-10-CM | POA: Diagnosis not present

## 2018-08-23 DIAGNOSIS — N200 Calculus of kidney: Secondary | ICD-10-CM | POA: Diagnosis not present

## 2018-08-23 DIAGNOSIS — M545 Low back pain: Secondary | ICD-10-CM | POA: Diagnosis not present

## 2018-08-25 DIAGNOSIS — R300 Dysuria: Secondary | ICD-10-CM | POA: Diagnosis not present

## 2018-08-25 DIAGNOSIS — N200 Calculus of kidney: Secondary | ICD-10-CM | POA: Diagnosis not present

## 2018-10-12 DIAGNOSIS — J029 Acute pharyngitis, unspecified: Secondary | ICD-10-CM | POA: Diagnosis not present

## 2018-10-12 DIAGNOSIS — J0100 Acute maxillary sinusitis, unspecified: Secondary | ICD-10-CM | POA: Diagnosis not present

## 2019-05-03 ENCOUNTER — Other Ambulatory Visit: Payer: Self-pay

## 2019-07-04 DIAGNOSIS — E789 Disorder of lipoprotein metabolism, unspecified: Secondary | ICD-10-CM | POA: Diagnosis not present

## 2019-07-04 DIAGNOSIS — Z0000 Encounter for general adult medical examination without abnormal findings: Secondary | ICD-10-CM | POA: Diagnosis not present

## 2019-07-04 DIAGNOSIS — Z125 Encounter for screening for malignant neoplasm of prostate: Secondary | ICD-10-CM | POA: Diagnosis not present

## 2019-07-18 DIAGNOSIS — Z7982 Long term (current) use of aspirin: Secondary | ICD-10-CM | POA: Diagnosis not present

## 2019-07-18 DIAGNOSIS — R251 Tremor, unspecified: Secondary | ICD-10-CM | POA: Diagnosis not present

## 2019-07-18 DIAGNOSIS — N200 Calculus of kidney: Secondary | ICD-10-CM | POA: Diagnosis not present

## 2019-07-18 DIAGNOSIS — R7303 Prediabetes: Secondary | ICD-10-CM | POA: Diagnosis not present

## 2019-07-18 DIAGNOSIS — J929 Pleural plaque without asbestos: Secondary | ICD-10-CM | POA: Diagnosis not present

## 2019-07-18 DIAGNOSIS — H9193 Unspecified hearing loss, bilateral: Secondary | ICD-10-CM | POA: Diagnosis not present

## 2019-07-18 DIAGNOSIS — E789 Disorder of lipoprotein metabolism, unspecified: Secondary | ICD-10-CM | POA: Diagnosis not present

## 2019-07-18 DIAGNOSIS — F5104 Psychophysiologic insomnia: Secondary | ICD-10-CM | POA: Diagnosis not present

## 2019-07-18 DIAGNOSIS — R0681 Apnea, not elsewhere classified: Secondary | ICD-10-CM | POA: Diagnosis not present

## 2019-07-18 DIAGNOSIS — Z0000 Encounter for general adult medical examination without abnormal findings: Secondary | ICD-10-CM | POA: Diagnosis not present

## 2019-09-05 DIAGNOSIS — R0681 Apnea, not elsewhere classified: Secondary | ICD-10-CM | POA: Diagnosis not present

## 2019-09-05 DIAGNOSIS — H903 Sensorineural hearing loss, bilateral: Secondary | ICD-10-CM | POA: Diagnosis not present

## 2019-09-05 DIAGNOSIS — J929 Pleural plaque without asbestos: Secondary | ICD-10-CM | POA: Diagnosis not present

## 2019-09-07 NOTE — Progress Notes (Signed)
Synopsis: Referred in December 2020 for pleural plaques by Anda Kraft, MD.  Subjective:   PATIENT ID: Kent Hartman GENDER: male DOB: 03/07/1951, MRN: [id number]  Chief Complaint  Patient presents with  . Consult    Patient was referred here for pulmonary plaque on his x-ray and for sleep apnea.     Mr. Dressel is a 68 year old gentleman with a history of abnormal chest x-ray to presents for evaluation.  He had a abnormal chest CT in 2004 with pleural plaques, and his PCP did follow-up imaging in 2010 and 2020.  He is referred for further evaluation.  He denies pulmonary symptoms-shortness of breath, cough, sputum, chest pain.  His family history is remarkable for smoking-related cough in his father, but is otherwise negative.  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  He never worked as a Pension scheme manager, Public relations account executive, or in a shipyard.  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  Currently he does woodworking as a hobby, and recently started wearing a mask doing this.  He never smoked or vaped.  He is up-to-date on seasonal flu and pneumonia vaccines.  He was also referred for sleep medicine evaluation of insomnia and possible OSA.  His wife his scene witnessed apneas up to 30 seconds before she pokes him to arouse him.  He sleeps in his recliner sometimes, which she has noticed has improved the apnea episodes.  His biggest complaint is insomnia with trouble falling asleep.  He watches the news around 11 and then tries to go to bed.  He lays in bed for hours tossing and turning, sometimes falling asleep.  He used to try to avoid daytime naps, but now feels like that is sometimes the only time he can get sleep.  He drinks decaffeinated beverages, very seldom drinking caffeine and only ever first thing in the morning.  He does not drink alcohol.  He has tried over-the-counter  sleep medicines in the past- first Benadryl, but this stopped working after about 6 months.  The other over-the-counter medications periodically work but not always.  He is retired, and reports that his wake-up time is not necessarily consistent.  He does not have a history of night time shift work.     Past Medical History:  Diagnosis Date  . Hard of hearing   . HLD (hyperlipidemia)   . Insomnia   . Kidney stone   . Kidney stones   . MRSA (methicillin resistant Staphylococcus aureus)   . Prediabetes      Family History  Problem Relation Age of Onset  . Rheum arthritis Mother   . Breast cancer Sister   . Pancreatic cancer Brother      Past Surgical History:  Procedure Laterality Date  . COLONOSCOPY      Social History   Socioeconomic History  . Marital status: Married    Spouse name: Not on file  . Number of children: Not on file  . Years of education: Not on file  . Highest education level: Not on file  Occupational History  . Not on file  Social Needs  . Financial resource strain: Not on file  . Food insecurity    Worry: Not on file    Inability: Not on file  . Transportation needs    Medical: Not on file    Non-medical: Not on file  Tobacco Use  .  Smoking status: Never Smoker  . Smokeless tobacco: Never Used  Substance and Sexual Activity  . Alcohol use: No  . Drug use: No  . Sexual activity: Not on file  Lifestyle  . Physical activity    Days per week: Not on file    Minutes per session: Not on file  . Stress: Not on file  Relationships  . Social Herbalist on phone: Not on file    Gets together: Not on file    Attends religious service: Not on file    Active member of club or organization: Not on file    Attends meetings of clubs or organizations: Not on file    Relationship status: Not on file  . Intimate partner violence    Fear of current or ex partner: Not on file    Emotionally abused: Not on file    Physically abused: Not on  file    Forced sexual activity: Not on file  Other Topics Concern  . Not on file  Social History Narrative  . Not on file     No Known Allergies   Immunization History  Administered Date(s) Administered  . Fluad Quad(high Dose 65+) 07/19/2019    Outpatient Medications Prior to Visit  Medication Sig Dispense Refill  . aspirin EC 81 MG tablet Take 81 mg by mouth every morning.    . Cholecalciferol (VITAMIN D3) 25 MCG (1000 UT) CAPS Take 1,000 Int'l Units by mouth daily.    . diphenhydrAMINE (BENADRYL) 25 MG tablet Take 500 mg by mouth at bedtime as needed for sleep.    Marland Kitchen MAGNESIUM ASPARTATE PO Take by mouth.    . Multiple Vitamins-Minerals (CENTRUM SILVER PO) Take 1 tablet by mouth daily.    . pravastatin (PRAVACHOL) 80 MG tablet Take 80 mg by mouth daily.    . vitamin C (ASCORBIC ACID) 500 MG tablet Take 500 mg by mouth daily.    . pseudoephedrine-guaifenesin (MUCINEX D) 60-600 MG per tablet Take 1 tablet by mouth every 12 (twelve) hours.    Marland Kitchen amoxicillin (AMOXIL) 500 MG capsule Take 1 capsule (500 mg total) by mouth 3 (three) times daily. 21 capsule 0  . cephALEXin (KEFLEX) 500 MG capsule Take 1 capsule (500 mg total) by mouth 2 (two) times daily. 20 capsule 0  . doxycycline (VIBRAMYCIN) 100 MG capsule Take 1 capsule (100 mg total) by mouth 2 (two) times daily. 14 capsule 0  . HYDROcodone-acetaminophen (NORCO) 5-325 MG per tablet Take 2 tablets by mouth every 4 (four) hours as needed. 6 tablet 0  . PRESCRIPTION MEDICATION Take 1 spray by mouth daily. Nasal spray     No facility-administered medications prior to visit.     Review of Systems  Constitutional: Negative for chills, fever and weight loss.  HENT: Negative.   Respiratory: Negative for cough, sputum production, shortness of breath and wheezing.   Cardiovascular: Negative for chest pain, palpitations and leg swelling.  Gastrointestinal: Negative for diarrhea, heartburn, nausea and vomiting.  Genitourinary: Negative.    Musculoskeletal: Negative.   Skin: Negative for itching and rash.  Neurological: Negative.   Endo/Heme/Allergies: Negative.      Objective:   Vitals:   09/08/19 0939  BP: 115/84  Pulse: 78  Temp: 97.9 F (36.6 C)  TempSrc: Temporal  SpO2: 96%  Weight: 252 lb 12.8 oz (114.7 kg)  Height: 6' 0.84" (1.85 m)   96% on   RA BMI Readings from Last 3 Encounters:  09/08/19 33.50  kg/m  06/21/15 32.96 kg/m  10/24/13 32.96 kg/m   Wt Readings from Last 3 Encounters:  09/08/19 252 lb 12.8 oz (114.7 kg)  06/21/15 243 lb (110.2 kg)  10/24/13 243 lb (110.2 kg)    Physical Exam Vitals signs reviewed.  Constitutional:      Appearance: He is not ill-appearing.  HENT:     Head: Normocephalic and atraumatic.     Nose:     Comments: Deferred due to masking requirement.    Mouth/Throat:     Comments: Deferred due to masking requirement. Eyes:     General: No scleral icterus. Neck:     Musculoskeletal: Neck supple.  Cardiovascular:     Rate and Rhythm: Normal rate and regular rhythm.     Heart sounds: No murmur.  Pulmonary:     Comments: Breathing comfortably on room air, no conversational dyspnea.  Clear to auscultation bilaterally. Abdominal:     General: There is no distension.     Palpations: Abdomen is soft.     Tenderness: There is no abdominal tenderness.  Musculoskeletal:        General: No swelling or deformity.  Lymphadenopathy:     Cervical: No cervical adenopathy.  Skin:    General: Skin is warm and dry.     Findings: No bruising or rash.  Neurological:     General: No focal deficit present.     Mental Status: He is alert.     Motor: No weakness.     Coordination: Coordination normal.  Psychiatric:        Mood and Affect: Mood normal.        Behavior: Behavior normal.      CBC    Component Value Date/Time   WBC 8.4 06/14/2016 1445   RBC 4.83 06/14/2016 1445   HGB 14.9 06/14/2016 1445   HCT 44.5 06/14/2016 1445   PLT 168 06/14/2016 1445   MCV  92.1 06/14/2016 1445   MCH 30.8 06/14/2016 1445   MCHC 33.5 06/14/2016 1445   RDW 13.6 06/14/2016 1445   LYMPHSABS 1.4 06/14/2016 1445   MONOABS 0.9 06/14/2016 1445   EOSABS 0.2 06/14/2016 1445   BASOSABS 0.1 06/14/2016 1445      Chest Imaging- films reviewed: CT abdomen pelvis [id number], lung slices reviewed- limited by respiratory motion, LLL linear scar and subpleural wedge-shaped opacity.  CT chest 09/04/2003-pleural calcifications bilaterally.  No significant adenopathy.  Dependent groundglass opacities bilateral lower lobes. 43mm LUL GGN.  CXR 09/06/2019-bilateral pleural plaques redemonstrated, right hemidiaphragm eventration.  Pulmonary Functions Testing Results: No flowsheet data found.      Assessment & Plan:     ICD-10-CM   1. Pleural plaque due to asbestos exposure  J92.0   2. Insomnia, unspecified type  G47.00    Calcified pleural plaques in the lower hemithorax and on the diaphragm-likely these are due to previous asbestos exposure. -Without pulmonary symptoms or significant abnormalities on his chest x-ray, I do not suspect asbestosis.  Pleural plaques on their own are benign.  We discussed that most people do not have other manifestations of asbestos related disease with these, but if he were to develop pulmonary symptoms, especially unresolving chest pain or shortness of breath, that should be evaluated quickly.  Luckily he has never smoked, so his cancer risk is not as significantly elevated as it would have been as a smoker. -No additional follow-up is required -Up-to-date on flu and pneumonia vaccines.   Insomnia -Discussed the importance of good sleep hygiene.  It sounds like for him avoiding screen time for 2 hours before bed and avoiding laying in bed awake are the most important things for him to avoid.  He will start setting a consistent wake up time and try to establish a bedtime routine. -Referring to Dr. Halford Chessman or Dr. Ander Slade for additional follow-up. -He  does not want to do a polysomnogram at this time as he is worried that it would not be diagnostic due to his insomnia.  He would rather try sleep hygiene measures before getting a sleep apnea test.  Follow up PRN. Recommend 4-week follow up for Sleep Medicine.    Current Outpatient Medications:  .  aspirin EC 81 MG tablet, Take 81 mg by mouth every morning., Disp: , Rfl:  .  Cholecalciferol (VITAMIN D3) 25 MCG (1000 UT) CAPS, Take 1,000 Int'l Units by mouth daily., Disp: , Rfl:  .  diphenhydrAMINE (BENADRYL) 25 MG tablet, Take 500 mg by mouth at bedtime as needed for sleep., Disp: , Rfl:  .  MAGNESIUM ASPARTATE PO, Take by mouth., Disp: , Rfl:  .  Multiple Vitamins-Minerals (CENTRUM SILVER PO), Take 1 tablet by mouth daily., Disp: , Rfl:  .  pravastatin (PRAVACHOL) 80 MG tablet, Take 80 mg by mouth daily., Disp: , Rfl:  .  vitamin C (ASCORBIC ACID) 500 MG tablet, Take 500 mg by mouth daily., Disp: , Rfl:  .  pseudoephedrine-guaifenesin (MUCINEX D) 60-600 MG per tablet, Take 1 tablet by mouth every 12 (twelve) hours., Disp: , Rfl:    Julian Hy, DO [facility] 09/08/2019 12:56 PM

## 2019-09-08 ENCOUNTER — Encounter: Payer: Self-pay | Admitting: Critical Care Medicine

## 2019-09-08 ENCOUNTER — Ambulatory Visit (INDEPENDENT_AMBULATORY_CARE_PROVIDER_SITE_OTHER): Payer: PPO | Admitting: Critical Care Medicine

## 2019-09-08 ENCOUNTER — Other Ambulatory Visit: Payer: Self-pay

## 2019-09-08 VITALS — BP 115/84 | HR 78 | Temp 97.9°F | Ht 72.84 in | Wt 252.8 lb

## 2019-09-08 DIAGNOSIS — G4700 Insomnia, unspecified: Secondary | ICD-10-CM

## 2019-09-08 DIAGNOSIS — J920 Pleural plaque with presence of asbestos: Secondary | ICD-10-CM | POA: Diagnosis not present

## 2019-09-08 NOTE — Patient Instructions (Addendum)
Thank you for visiting Dr. Carlis Abbott at [facility]. We recommend the following:   Return in about 4 weeks (around 10/06/2019).  You will see Dr. Halford Chessman or Dr. Ander Slade for sleep medicine.   Sleep hygiene: Pick a consistent wake up and bedtime. Do not lay in bed awake more than 20-30 minutes. No TV/ screen time for about 2 hours before bed. Pick a quiet non-electronics activity to do before bed.   Please do your part to reduce the spread of COVID-19.

## 2019-09-08 NOTE — Addendum Note (Signed)
Addended by: Julian Hy on: 09/08/2019 12:57 PM   Modules accepted: Level of Service

## 2020-02-15 DIAGNOSIS — H40022 Open angle with borderline findings, high risk, left eye: Secondary | ICD-10-CM | POA: Diagnosis not present

## 2020-03-25 DIAGNOSIS — L03116 Cellulitis of left lower limb: Secondary | ICD-10-CM | POA: Diagnosis not present

## 2020-03-25 DIAGNOSIS — L02416 Cutaneous abscess of left lower limb: Secondary | ICD-10-CM | POA: Diagnosis not present

## 2020-04-04 DIAGNOSIS — H40021 Open angle with borderline findings, high risk, right eye: Secondary | ICD-10-CM | POA: Diagnosis not present

## 2020-04-04 DIAGNOSIS — H401122 Primary open-angle glaucoma, left eye, moderate stage: Secondary | ICD-10-CM | POA: Diagnosis not present

## 2020-04-04 DIAGNOSIS — H40022 Open angle with borderline findings, high risk, left eye: Secondary | ICD-10-CM | POA: Diagnosis not present

## 2020-04-30 ENCOUNTER — Other Ambulatory Visit: Payer: Self-pay

## 2020-05-02 DIAGNOSIS — H401122 Primary open-angle glaucoma, left eye, moderate stage: Secondary | ICD-10-CM | POA: Diagnosis not present

## 2020-05-02 DIAGNOSIS — H40022 Open angle with borderline findings, high risk, left eye: Secondary | ICD-10-CM | POA: Diagnosis not present

## 2020-05-02 DIAGNOSIS — H40021 Open angle with borderline findings, high risk, right eye: Secondary | ICD-10-CM | POA: Diagnosis not present

## 2020-07-08 DIAGNOSIS — Z125 Encounter for screening for malignant neoplasm of prostate: Secondary | ICD-10-CM | POA: Diagnosis not present

## 2020-07-08 DIAGNOSIS — E789 Disorder of lipoprotein metabolism, unspecified: Secondary | ICD-10-CM | POA: Diagnosis not present

## 2020-07-08 DIAGNOSIS — Z0000 Encounter for general adult medical examination without abnormal findings: Secondary | ICD-10-CM | POA: Diagnosis not present

## 2020-07-08 DIAGNOSIS — R739 Hyperglycemia, unspecified: Secondary | ICD-10-CM | POA: Diagnosis not present

## 2020-07-22 DIAGNOSIS — J302 Other seasonal allergic rhinitis: Secondary | ICD-10-CM | POA: Diagnosis not present

## 2020-07-22 DIAGNOSIS — Z23 Encounter for immunization: Secondary | ICD-10-CM | POA: Diagnosis not present

## 2020-07-22 DIAGNOSIS — Z0000 Encounter for general adult medical examination without abnormal findings: Secondary | ICD-10-CM | POA: Diagnosis not present

## 2020-07-22 DIAGNOSIS — E789 Disorder of lipoprotein metabolism, unspecified: Secondary | ICD-10-CM | POA: Diagnosis not present

## 2020-07-22 DIAGNOSIS — J929 Pleural plaque without asbestos: Secondary | ICD-10-CM | POA: Diagnosis not present

## 2020-07-22 DIAGNOSIS — Z1212 Encounter for screening for malignant neoplasm of rectum: Secondary | ICD-10-CM | POA: Diagnosis not present

## 2020-07-22 DIAGNOSIS — R030 Elevated blood-pressure reading, without diagnosis of hypertension: Secondary | ICD-10-CM | POA: Diagnosis not present

## 2020-07-22 DIAGNOSIS — R7303 Prediabetes: Secondary | ICD-10-CM | POA: Diagnosis not present

## 2020-07-22 DIAGNOSIS — J4520 Mild intermittent asthma, uncomplicated: Secondary | ICD-10-CM | POA: Diagnosis not present

## 2020-08-15 DIAGNOSIS — I10 Essential (primary) hypertension: Secondary | ICD-10-CM | POA: Diagnosis not present

## 2020-08-15 DIAGNOSIS — R7303 Prediabetes: Secondary | ICD-10-CM | POA: Diagnosis not present

## 2020-08-15 DIAGNOSIS — R7301 Impaired fasting glucose: Secondary | ICD-10-CM | POA: Diagnosis not present

## 2020-09-02 DIAGNOSIS — I10 Essential (primary) hypertension: Secondary | ICD-10-CM | POA: Diagnosis not present

## 2020-09-09 DIAGNOSIS — R051 Acute cough: Secondary | ICD-10-CM | POA: Diagnosis not present

## 2020-09-09 DIAGNOSIS — R0981 Nasal congestion: Secondary | ICD-10-CM | POA: Diagnosis not present

## 2020-09-09 DIAGNOSIS — Z20828 Contact with and (suspected) exposure to other viral communicable diseases: Secondary | ICD-10-CM | POA: Diagnosis not present

## 2020-11-12 DIAGNOSIS — H401122 Primary open-angle glaucoma, left eye, moderate stage: Secondary | ICD-10-CM | POA: Diagnosis not present

## 2020-11-26 DIAGNOSIS — R7303 Prediabetes: Secondary | ICD-10-CM | POA: Diagnosis not present

## 2020-11-26 DIAGNOSIS — I10 Essential (primary) hypertension: Secondary | ICD-10-CM | POA: Diagnosis not present

## 2020-11-26 DIAGNOSIS — K5903 Drug induced constipation: Secondary | ICD-10-CM | POA: Diagnosis not present

## 2020-11-26 DIAGNOSIS — R7301 Impaired fasting glucose: Secondary | ICD-10-CM | POA: Diagnosis not present

## 2020-11-26 DIAGNOSIS — G4700 Insomnia, unspecified: Secondary | ICD-10-CM | POA: Diagnosis not present

## 2021-03-04 DIAGNOSIS — I10 Essential (primary) hypertension: Secondary | ICD-10-CM | POA: Diagnosis not present

## 2021-03-04 DIAGNOSIS — R7301 Impaired fasting glucose: Secondary | ICD-10-CM | POA: Diagnosis not present

## 2021-03-04 DIAGNOSIS — K5903 Drug induced constipation: Secondary | ICD-10-CM | POA: Diagnosis not present

## 2021-03-04 DIAGNOSIS — R7303 Prediabetes: Secondary | ICD-10-CM | POA: Diagnosis not present

## 2021-03-04 DIAGNOSIS — G4700 Insomnia, unspecified: Secondary | ICD-10-CM | POA: Diagnosis not present

## 2021-06-11 ENCOUNTER — Emergency Department (HOSPITAL_COMMUNITY): Payer: PPO

## 2021-06-11 ENCOUNTER — Other Ambulatory Visit: Payer: Self-pay

## 2021-06-11 ENCOUNTER — Emergency Department (HOSPITAL_COMMUNITY)
Admission: EM | Admit: 2021-06-11 | Discharge: 2021-06-11 | Disposition: A | Discharge status: Home / Self Care | Payer: PPO | Attending: Emergency Medicine | Admitting: Emergency Medicine

## 2021-06-11 DIAGNOSIS — Z79899 Other long term (current) drug therapy: Secondary | ICD-10-CM | POA: Diagnosis not present

## 2021-06-11 DIAGNOSIS — N200 Calculus of kidney: Secondary | ICD-10-CM | POA: Diagnosis not present

## 2021-06-11 DIAGNOSIS — Z7982 Long term (current) use of aspirin: Secondary | ICD-10-CM | POA: Insufficient documentation

## 2021-06-11 DIAGNOSIS — I700 Atherosclerosis of aorta: Secondary | ICD-10-CM | POA: Diagnosis not present

## 2021-06-11 DIAGNOSIS — N1330 Unspecified hydronephrosis: Secondary | ICD-10-CM | POA: Diagnosis not present

## 2021-06-11 DIAGNOSIS — K7689 Other specified diseases of liver: Secondary | ICD-10-CM | POA: Diagnosis not present

## 2021-06-11 DIAGNOSIS — R109 Unspecified abdominal pain: Secondary | ICD-10-CM | POA: Diagnosis present

## 2021-06-11 LAB — URINALYSIS, ROUTINE W REFLEX MICROSCOPIC
Bilirubin Urine: NEGATIVE
Glucose, UA: NEGATIVE mg/dL
Ketones, ur: NEGATIVE mg/dL
Leukocytes,Ua: NEGATIVE
Nitrite: NEGATIVE
Protein, ur: NEGATIVE mg/dL
Specific Gravity, Urine: >1.03 — ABNORMAL HIGH (ref 1.005–1.030)
pH: 6 (ref 5.0–8.0)

## 2021-06-11 LAB — CBC WITH DIFFERENTIAL/PLATELET
Abs Immature Granulocytes: 0.02 10*3/uL (ref 0.00–0.07)
Basophils Absolute: 0.1 10*3/uL (ref 0.0–0.1)
Basophils Relative: 1 %
Eosinophils Absolute: 0.2 10*3/uL (ref 0.0–0.5)
Eosinophils Relative: 2 %
HCT: 44.3 % (ref 39.0–52.0)
Hemoglobin: 14.8 g/dL (ref 13.0–17.0)
Immature Granulocytes: 0 %
Lymphocytes Relative: 16 %
Lymphs Abs: 1.4 10*3/uL (ref 0.7–4.0)
MCH: 31.5 pg (ref 26.0–34.0)
MCHC: 33.4 g/dL (ref 30.0–36.0)
MCV: 94.3 fL (ref 80.0–100.0)
Monocytes Absolute: 0.8 10*3/uL (ref 0.1–1.0)
Monocytes Relative: 10 %
Neutro Abs: 5.9 10*3/uL (ref 1.7–7.7)
Neutrophils Relative %: 71 %
Platelets: 175 10*3/uL (ref 150–400)
RBC: 4.7 MIL/uL (ref 4.22–5.81)
RDW: 12.7 % (ref 11.5–15.5)
WBC: 8.4 10*3/uL (ref 4.0–10.5)
nRBC: 0 % (ref 0.0–0.2)

## 2021-06-11 LAB — URINALYSIS, MICROSCOPIC (REFLEX): Squamous Epithelial / HPF: NONE SEEN (ref 0–5)

## 2021-06-11 LAB — BASIC METABOLIC PANEL
Anion gap: 8 (ref 5–15)
BUN: 26 mg/dL — ABNORMAL HIGH (ref 8–23)
CO2: 21 mmol/L — ABNORMAL LOW (ref 22–32)
Calcium: 8.6 mg/dL — ABNORMAL LOW (ref 8.9–10.3)
Chloride: 106 mmol/L (ref 98–111)
Creatinine, Ser: 1.21 mg/dL (ref 0.61–1.24)
GFR, Estimated: >60 mL/min (ref >60)
Glucose, Bld: 133 mg/dL — ABNORMAL HIGH (ref 70–99)
Potassium: 4.1 mmol/L (ref 3.5–5.1)
Sodium: 135 mmol/L (ref 135–145)

## 2021-06-11 NOTE — ED Provider Notes (Signed)
[place] EMERGENCY DEPARTMENT Provider Note   CSN: [id number] Arrival date & time: 06/11/21  0459     History Chief Complaint  Patient presents with   Flank Pain    Kent Hartman is a 70 y.o. male.  HPI Patient presents with his wife who assists with the history. The patient awoke this morning about 6 hours prior to my evaluation with sharp severe pain in the left flank.  There is associated radiation inferiorly, but symptoms improved, and by the time my evaluation pain is minimal. There is a source of nausea, but no vomiting, no diarrhea, no hematuria. Has a history of kidney stones in the past.     Past Medical History:  Diagnosis Date   Hard of hearing    HLD (hyperlipidemia)    Insomnia    Kidney stone    Kidney stones    MRSA (methicillin resistant Staphylococcus aureus)    Prediabetes     There are no problems to display for this patient.   Past Surgical History:  Procedure Laterality Date   COLONOSCOPY         Family History  Problem Relation Age of Onset   Rheum arthritis Mother    Breast cancer Sister    Pancreatic cancer Brother     Social History   Tobacco Use   Smoking status: Never   Smokeless tobacco: Never  Vaping Use   Vaping Use: Never used  Substance Use Topics   Alcohol use: No   Drug use: No    Home Medications Prior to Admission medications   Medication Sig Start Date End Date Taking? Authorizing Provider  aspirin EC 81 MG tablet Take 81 mg by mouth every morning.    [provider]  Cholecalciferol (VITAMIN D3) 25 MCG (1000 UT) CAPS Take 1,000 Int'l Units by mouth daily.    [provider]  diphenhydrAMINE (BENADRYL) 25 MG tablet Take 500 mg by mouth at bedtime as needed for sleep.    [provider]  MAGNESIUM ASPARTATE PO Take by mouth.    [provider]  Multiple Vitamins-Minerals (CENTRUM SILVER PO) Take 1 tablet by mouth daily.    [provider]   pravastatin (PRAVACHOL) 80 MG tablet Take 80 mg by mouth daily.    [provider]  pseudoephedrine-guaifenesin (MUCINEX D) 60-600 MG per tablet Take 1 tablet by mouth every 12 (twelve) hours.    [provider]  vitamin C (ASCORBIC ACID) 500 MG tablet Take 500 mg by mouth daily.    [provider]    Allergies    Patient has no known allergies.  Review of Systems   Review of Systems  Constitutional:        Per HPI, otherwise negative  HENT:         Per HPI, otherwise negative  Respiratory:         Per HPI, otherwise negative  Cardiovascular:        Per HPI, otherwise negative  Gastrointestinal:  Negative for vomiting.  Endocrine:       Negative aside from HPI  Genitourinary:        Neg aside from HPI   Musculoskeletal:        Per HPI, otherwise negative  Skin: Negative.   Neurological:  Negative for syncope.   Physical Exam Updated Vital Signs BP 131/90   Pulse 64   Temp 97.7 F (36.5 C)   Resp 17   Ht 5' (1.524  m)   Wt 114 kg   SpO2 100%   BMI 49.08 kg/m   Physical Exam Vitals and nursing note reviewed.  Constitutional:      General: He is not in acute distress.    Appearance: He is well-developed.  HENT:     Head: Normocephalic and atraumatic.  Eyes:     Conjunctiva/sclera: Conjunctivae normal.  Cardiovascular:     Rate and Rhythm: Normal rate and regular rhythm.  Pulmonary:     Effort: Pulmonary effort is normal. No respiratory distress.     Breath sounds: No stridor.  Abdominal:     General: There is no distension.     Comments: Minimal TTP L flank. No abd pain.  Skin:    General: Skin is warm and dry.  Neurological:     Mental Status: He is alert and oriented to person, place, and time.  Psychiatric:        Mood and Affect: Mood normal.    ED Results / Procedures / Treatments   Labs (all labs ordered are listed, but only abnormal results are displayed) Labs Reviewed  BASIC METABOLIC PANEL - Abnormal; Notable for  the following components:      Result Value   CO2 21 (*)    Glucose, Bld 133 (*)    BUN 26 (*)    Calcium 8.6 (*)    All other components within normal limits  URINALYSIS, ROUTINE W REFLEX MICROSCOPIC - Abnormal; Notable for the following components:   Specific Gravity, Urine >1.030 (*)    Hgb urine dipstick TRACE (*)    All other components within normal limits  URINALYSIS, MICROSCOPIC (REFLEX) - Abnormal; Notable for the following components:   Bacteria, UA RARE (*)    All other components within normal limits  CBC WITH DIFFERENTIAL/PLATELET    EKG None  Radiology CT Renal Stone Study  Result Date: 06/11/2021 CLINICAL DATA:  Left flank pain. EXAM: CT ABDOMEN AND PELVIS WITHOUT CONTRAST TECHNIQUE: Multidetector CT imaging of the abdomen and pelvis was performed following the standard protocol without IV contrast. COMPARISON:  CT scan 10/24/2013 FINDINGS: Lower chest: The lung bases are clear of acute process. No pleural effusion or pulmonary lesions. Scattered calcified granulomas are noted. The heart is normal in size. No pericardial effusion. Scattered aortic and coronary calcifications are noted. Hepatobiliary: Stable calcification right hepatic. No worrisome hepatic lesions are identified without contrast. No intrahepatic biliary dilatation. The gallbladder appears. No common bile duct dilatation. Pancreas: No mass, inflammation ductal dilatation. Spleen: Normal size. No focal lesions. Adrenals/Urinary Tract: The adrenal glands are. Bilateral renal calculi are noted. 12 mm calculus noted in the left renal pelvis. There is mild left-sided hydronephrosis but no hydroureter. Possible passed ureteral calculus or possible intermittently obstructing left renal calculus. No ureteral calculi. No bladder calculi. No worrisome renal or bladder lesions are identified without contrast. Stomach/Bowel: The stomach, duodenum, small bowel and colon are grossly normal without oral contrast. No inflammatory  changes, mass lesions or obstructive findings. The appendix is normal. Vascular/Lymphatic: Moderate aortic ostial branch vessel calcifications. No aneurysm. No adenopathy. Reproductive: The prostate gland and seminal vesicles are unremarkable. Other: No pelvic mass or adenopathy. No free pelvic fluid collections. No inguinal mass or adenopathy. No abdominal wall hernia or subcutaneous lesions. Musculoskeletal: No significant bony findings. IMPRESSION: 1. 12 mm calculus in the left renal pelvis with mild left-sided hydronephrosis but no hydroureter. Possible passed ureteral calculus or possible intermittently obstructing left renal calculus. 2. Bilateral renal calculi. 3. No  worrisome renal or bladder lesions without contrast. 4. No acute abdominal/pelvic findings, mass lesions or adenopathy. 5. Aortic atherosclerosis. Aortic Atherosclerosis (ICD10-I70.0). Electronically Signed   By: Marijo Sanes M.D.   On: 06/11/2021 06:19    Procedures Procedures   Medications Ordered in ED Medications - No data to display  ED Course  I have reviewed the triage vital signs and the nursing notes.  Pertinent labs & imaging results that were available during my care of the patient were reviewed by me and considered in my medical decision making (see chart for details).   Adult male presents with new left flank pain sudden onset, improved by my evaluation.  Here he is found to have kidney stone, and wife present, at bedside we reviewed the CT imaging together, including demonstration of the intrarenal stone, left-sided.  Urinalysis reassuring, vitals reassuring, no evidence for concurrent infection, peritonitis.  Patient amenable to ongoing outpatient follow-up with analgesic regimen.  Final Clinical Impression(s) / ED Diagnoses Final diagnoses:  Nephrolithiasis     Carmin Muskrat, MD 06/11/21 1258

## 2021-06-11 NOTE — Discharge Instructions (Addendum)
As discussed, you have been diagnosed with a kidney stone.  Please use ibuprofen, 400 mg, taken 3 times daily with food for the next 3 days.  You may use Tylenol in addition to this for pain control.  As discussed, you likely have 1 kidney stone remaining it is large, in your left kidney, and this should be discussed with your urologist.  Return here for concerning changes in your condition.  Below is the interpretation of today's CT scan: IMPRESSION: 1. 12 mm calculus in the left renal pelvis with mild left-sided hydronephrosis but no hydroureter. Possible passed ureteral calculus or possible intermittently obstructing left renal calculus. 2. Bilateral renal calculi. 3. No worrisome renal or bladder lesions without contrast. 4. No acute abdominal/pelvic findings, mass lesions or adenopathy. 5. Aortic atherosclerosis.

## 2021-06-11 NOTE — ED Provider Notes (Signed)
Emergency Medicine Provider Triage Evaluation Note  Kent Hartman , a 70 y.o. male  was evaluated in triage.  Pt complains of left flank pain.  Symptoms started yesterday evening, he reports pain was intermittent, but then would suddenly become sharp and severe.  Pain would sometimes radiate around into his abdomen.  Pain sometimes worse with movement initially thought he this may be due to strain after he was lifting some heavy boards during the day but pain worsened through the night.  No associated nausea or vomiting.  No dysuria, urinary frequency or hematuria, but patient does have prior history of a kidney stone, and this feels somewhat similar.  No associated chest pain or shortness of breath.  Pain is not worse with inspiration.  Review of Systems  Positive: Flank pain Negative: Vomiting, fevers, chest pain, shortness of breath  Physical Exam  BP 122/83 (BP Location: Left Arm)   Pulse 77   Temp 98.1 F (36.7 C) (Oral)   Resp 16   Ht 5' (1.524 m)   Wt 114 kg   SpO2 97%   BMI 49.08 kg/m  Gen:   Awake, no distress   Resp:  Normal effort  MSK:   Moves extremities without difficulty  Other:  Abdomen soft, nondistended, nontender in all quadrants, no focal CVA tenderness but pain is well localized over the left flank  Medical Decision Making  Medically screening exam initiated at 5:05 AM.  Appropriate orders placed.  Salome R Saucerman was informed that the remainder of the evaluation will be completed by another provider, this initial triage assessment does not replace that evaluation, and the importance of remaining in the ED until their evaluation is complete.     Jacqlyn Larsen, PA-C 06/11/21 [id number]    Quintella Reichert, MD 06/11/21 0700

## 2021-06-11 NOTE — ED Triage Notes (Signed)
Pt c/o left side flank pain. Pt denies any urinary complaints.

## 2021-07-21 DIAGNOSIS — R739 Hyperglycemia, unspecified: Secondary | ICD-10-CM | POA: Diagnosis not present

## 2021-07-21 DIAGNOSIS — R7303 Prediabetes: Secondary | ICD-10-CM | POA: Diagnosis not present

## 2021-07-21 DIAGNOSIS — Z125 Encounter for screening for malignant neoplasm of prostate: Secondary | ICD-10-CM | POA: Diagnosis not present

## 2021-07-28 DIAGNOSIS — Z23 Encounter for immunization: Secondary | ICD-10-CM | POA: Diagnosis not present

## 2021-07-28 DIAGNOSIS — E789 Disorder of lipoprotein metabolism, unspecified: Secondary | ICD-10-CM | POA: Diagnosis not present

## 2021-07-28 DIAGNOSIS — J302 Other seasonal allergic rhinitis: Secondary | ICD-10-CM | POA: Diagnosis not present

## 2021-07-28 DIAGNOSIS — I10 Essential (primary) hypertension: Secondary | ICD-10-CM | POA: Diagnosis not present

## 2021-07-28 DIAGNOSIS — N200 Calculus of kidney: Secondary | ICD-10-CM | POA: Diagnosis not present

## 2021-07-28 DIAGNOSIS — R319 Hematuria, unspecified: Secondary | ICD-10-CM | POA: Diagnosis not present

## 2021-07-28 DIAGNOSIS — R7303 Prediabetes: Secondary | ICD-10-CM | POA: Diagnosis not present

## 2021-07-28 DIAGNOSIS — J4520 Mild intermittent asthma, uncomplicated: Secondary | ICD-10-CM | POA: Diagnosis not present

## 2021-07-28 DIAGNOSIS — Z0000 Encounter for general adult medical examination without abnormal findings: Secondary | ICD-10-CM | POA: Diagnosis not present

## 2021-07-29 ENCOUNTER — Other Ambulatory Visit: Payer: Self-pay | Admitting: Internal Medicine

## 2021-07-29 DIAGNOSIS — I10 Essential (primary) hypertension: Secondary | ICD-10-CM

## 2021-08-19 ENCOUNTER — Ambulatory Visit
Admission: RE | Admit: 2021-08-19 | Discharge: 2021-08-19 | Disposition: A | Discharge status: Home / Self Care | Payer: PPO | Source: Ambulatory Visit | Attending: Internal Medicine | Admitting: Internal Medicine

## 2021-08-19 DIAGNOSIS — I10 Essential (primary) hypertension: Secondary | ICD-10-CM

## 2021-09-07 DIAGNOSIS — Z131 Encounter for screening for diabetes mellitus: Secondary | ICD-10-CM | POA: Diagnosis not present

## 2021-09-09 DIAGNOSIS — I2584 Coronary atherosclerosis due to calcified coronary lesion: Secondary | ICD-10-CM | POA: Diagnosis not present

## 2021-09-09 DIAGNOSIS — I10 Essential (primary) hypertension: Secondary | ICD-10-CM | POA: Diagnosis not present

## 2021-09-09 DIAGNOSIS — I2510 Atherosclerotic heart disease of native coronary artery without angina pectoris: Secondary | ICD-10-CM | POA: Diagnosis not present

## 2021-09-26 ENCOUNTER — Encounter: Payer: Self-pay | Admitting: *Deleted

## 2021-09-26 ENCOUNTER — Encounter: Payer: Self-pay | Admitting: Cardiology

## 2021-09-26 ENCOUNTER — Ambulatory Visit: Payer: PPO | Admitting: Cardiology

## 2021-09-26 ENCOUNTER — Other Ambulatory Visit: Payer: Self-pay

## 2021-09-26 DIAGNOSIS — I7121 Aneurysm of the ascending aorta, without rupture: Secondary | ICD-10-CM

## 2021-09-26 DIAGNOSIS — R0600 Dyspnea, unspecified: Secondary | ICD-10-CM | POA: Diagnosis not present

## 2021-09-26 DIAGNOSIS — I2510 Atherosclerotic heart disease of native coronary artery without angina pectoris: Secondary | ICD-10-CM | POA: Diagnosis not present

## 2021-09-26 DIAGNOSIS — I712 Thoracic aortic aneurysm, without rupture, unspecified: Secondary | ICD-10-CM | POA: Insufficient documentation

## 2021-09-26 NOTE — Progress Notes (Addendum)
Cardiology Office Note:    Date:  09/26/2021   ID:  Kent Hartman, DOB December 30, 1950, MRN [id number]  PCP:  Kent Pretty, MD   [facility] Providers Cardiologist:  None     Referring MD: Kent Pretty, MD    History of Present Illness:    Kent Hartman is a 70 y.o. male here for the evaluation of calcified coronary plaque and mildly dilated ascending aorta 40 mm.  1. Coronary calcium score 720 is at the 80th percentile for the patient's age, sex and race. 2. Mild aneurysmal disease of the ascending thoracic aorta measuring up to approximately 4 cm in maximum diameter  Died 80 father in sleep perhaps cardiac  Never smoked  Recently switched from pravastatin to Crestor.  Excellent.  Feels some dyspnea going up hill in yard at times.   Walks 3 miles a day. Does well.   Past Medical History:  Diagnosis Date   Calculus, kidney    Cellulitis and abscess of left lower extremity    Coronary atherosclerosis    Disorder of lipoprotein metabolism    Hard of hearing    Hematuria    HLD (hyperlipidemia)    Hyperglycemia    Insomnia    Kidney stones    MRSA (methicillin resistant Staphylococcus aureus)    Nephrolithiasis    Pleural plaque without asbestos    Prediabetes     Past Surgical History:  Procedure Laterality Date   COLONOSCOPY      Current Medications: Current Meds  Medication Sig   aspirin EC 81 MG tablet Take 81 mg by mouth every morning.   bisacodyl (DULCOLAX) 5 MG EC tablet Take 5 mg by mouth daily.   Cholecalciferol (VITAMIN D3) 25 MCG (1000 UT) CAPS Take 1,000 Int'l Units by mouth daily.   diphenhydrAMINE (BENADRYL) 25 MG tablet Take 500 mg by mouth at bedtime as needed for sleep.   latanoprost (XALATAN) 0.005 % ophthalmic solution 1 drop at bedtime.   losartan (COZAAR) 50 MG tablet Take 50 mg by mouth daily.   MAGNESIUM ASPARTATE PO Take by mouth.   melatonin 5 MG TABS Take 5 mg by mouth daily.   Multiple Vitamins-Minerals (CENTRUM SILVER PO)  Take 1 tablet by mouth daily.   pseudoephedrine-guaifenesin (MUCINEX D) 60-600 MG per tablet Take 1 tablet by mouth every 12 (twelve) hours.   Pyridoxine HCl (VITAMIN B6) 100 MG TABS Take 100 mg by mouth daily.   rosuvastatin (CRESTOR) 20 MG tablet Take 20 mg by mouth daily.   vitamin C (ASCORBIC ACID) 500 MG tablet Take 500 mg by mouth daily.     Allergies:   Patient has no known allergies.   Social History   Socioeconomic History   Marital status: Married    Spouse name: Not on file   Number of children: Not on file   Years of education: Not on file   Highest education level: Not on file  Occupational History   Not on file  Tobacco Use   Smoking status: Never   Smokeless tobacco: Never  Vaping Use   Vaping Use: Never used  Substance and Sexual Activity   Alcohol use: No   Drug use: No   Sexual activity: Not on file  Other Topics Concern   Not on file  Social History Narrative   Not on file   Social Determinants of Health   Financial Resource Strain: Not on file  Food Insecurity: Not on file  Transportation Needs: Not on file  Physical Activity: Not on file  Stress: Not on file  Social Connections: Not on file     Family History: [name] family history includes Breast cancer in his sister; Pancreatic cancer in his brother; Rheum arthritis in his mother.  ROS:   Please see the history of present illness.    No fevers chills nausea vomiting syncope bleeding all other systems reviewed and are negative.  EKGs/Labs/Other Studies Reviewed:    The following studies were reviewed today: Coronary calcium score Office notes reviewed lab work reviewed  EKG:  EKG is  ordered today.  The ekg ordered today demonstrates sinus rhythm 71 no other abnormalities  Recent Labs: 06/11/2021: BUN 26; Creatinine, Ser 1.21; Hemoglobin 14.8; Platelets 175; Potassium 4.1; Sodium 135  Recent Lipid Panel No results found for: CHOL, TRIG, HDL, CHOLHDL, VLDL, LDLCALC,  LDLDIRECT   Risk Assessment/Calculations:              Physical Exam:    VS:  BP 110/84    Pulse 71    Ht 5' (1.524 m)    Wt 242 lb 6.4 oz (110 kg)    SpO2 96%    BMI 47.34 kg/m     Wt Readings from Last 3 Encounters:  09/26/21 242 lb 6.4 oz (110 kg)  06/11/21 251 lb 5.2 oz (114 kg)  09/08/19 252 lb 12.8 oz (114.7 kg)     GEN:  Well nourished, well developed in no acute distress HEENT: Normal NECK: No JVD; No carotid bruits LYMPHATICS: No lymphadenopathy CARDIAC: RRR, no murmurs, no rubs, gallops RESPIRATORY:  Clear to auscultation without rales, wheezing or rhonchi  ABDOMEN: Soft, non-tender, non-distended MUSCULOSKELETAL:  No edema; No deformity  SKIN: Warm and dry NEUROLOGIC:  Alert and oriented x 3 PSYCHIATRIC:  Normal affect   ASSESSMENT:    1. Coronary artery disease involving native coronary artery of native heart without angina pectoris   2. Dyspnea, unspecified type   3. Aneurysm of ascending aorta without rupture    PLAN:    In order of problems listed above:  Coronary artery disease involving native coronary artery of native heart without angina pectoris Elevated calcium score 700. Agree Crestor 20. LDL goal < 70. ASA 81 With score and dyspnea checking NUC stress test ECHO as well.   Dyspnea Does not necessarily feel dyspnea on his walks with his wife however doing chores or walking up a hill in the yard he may have to stop as he feels short of breath.  Because of this, checking stress test with his calcium score as well of 700.  Aneurysm of ascending aorta 40 mm seen on CT.  Continue to monitor.       Shared Decision Making/Informed Consent The risks [chest pain, shortness of breath, cardiac arrhythmias, dizziness, blood pressure fluctuations, myocardial infarction, stroke/transient ischemic attack, nausea, vomiting, allergic reaction, radiation exposure, metallic taste sensation and life-threatening complications (estimated to be 1 in 10,000)],  benefits (risk stratification, diagnosing coronary artery disease, treatment guidance) and alternatives of a nuclear stress test were discussed in detail with [name] and he agrees to proceed.    Medication Adjustments/Labs and Tests Ordered: Current medicines are reviewed at length with the patient today.  Concerns regarding medicines are outlined above.  Orders Placed This Encounter  Procedures   Cardiac Stress Test: Informed Consent Details: Physician/Practitioner Attestation; Transcribe to consent form and obtain patient signature   MYOCARDIAL PERFUSION IMAGING   EKG 12-Lead   ECHOCARDIOGRAM COMPLETE   No orders  of the defined types were placed in this encounter.   Patient Instructions  Medication Instructions:  The current medical regimen is effective;  continue present plan and medications.  *If you need a refill on your cardiac medications before your next appointment, please call your pharmacy*  Testing/Procedures: Your physician has requested that you have an echocardiogram. Echocardiography is a painless test that uses sound waves to create images of your heart. It provides your doctor with information about the size and shape of your heart and how well your hearts chambers and valves are working. This procedure takes approximately one hour. There are no restrictions for this procedure.  Your physician has requested that you have a myoview. For further information please visit [email or web address]. Please follow instruction sheet, as given.  Follow-Up: At [facility], you and your health needs are our priority.  As part of our continuing mission to provide you with exceptional heart care, we have created designated Provider Care Teams.  These Care Teams include your primary Cardiologist (physician) and Advanced Practice Providers (APPs -  Physician Assistants and Nurse Practitioners) who all work together to provide you with the care you need, when you need it.  We  recommend signing up for the patient portal called "MyChart".  Sign up information is provided on this After Visit Summary.  MyChart is used to connect with patients for Virtual Visits (Telemedicine).  Patients are able to view lab/test results, encounter notes, upcoming appointments, etc.  Non-urgent messages can be sent to your provider as well.   To learn more about what you can do with MyChart, go to [email or web address].    Your next appointment:   1 year(s)  The format for your next appointment:   In Person  Provider:   Dr Candee Furbish If MD is not listed, click here to update    :1}    Thank you for choosing [facility]!!      Signed, Candee Furbish, MD  09/26/2021 3:51 PM    [place]

## 2021-09-26 NOTE — Patient Instructions (Signed)
Medication Instructions:  The current medical regimen is effective;  continue present plan and medications.  *If you need a refill on your cardiac medications before your next appointment, please call your pharmacy*  Testing/Procedures: Your physician has requested that you have an echocardiogram. Echocardiography is a painless test that uses sound waves to create images of your heart. It provides your doctor with information about the size and shape of your heart and how well your hearts chambers and valves are working. This procedure takes approximately one hour. There are no restrictions for this procedure.  Your physician has requested that you have a myoview. For further information please visit [email or web address]. Please follow instruction sheet, as given.  Follow-Up: At [facility], you and your health needs are our priority.  As part of our continuing mission to provide you with exceptional heart care, we have created designated Provider Care Teams.  These Care Teams include your primary Cardiologist (physician) and Advanced Practice Providers (APPs -  Physician Assistants and Nurse Practitioners) who all work together to provide you with the care you need, when you need it.  We recommend signing up for the patient portal called "MyChart".  Sign up information is provided on this After Visit Summary.  MyChart is used to connect with patients for Virtual Visits (Telemedicine).  Patients are able to view lab/test results, encounter notes, upcoming appointments, etc.  Non-urgent messages can be sent to your provider as well.   To learn more about what you can do with MyChart, go to [email or web address].    Your next appointment:   1 year(s)  The format for your next appointment:   In Person  Provider:   Dr Candee Furbish If MD is not listed, click here to update    :1}    Thank you for choosing [facility]!!

## 2021-09-26 NOTE — Assessment & Plan Note (Signed)
Does not necessarily feel dyspnea on his walks with his wife however doing chores or walking up a hill in the yard he may have to stop as he feels short of breath.  Because of this, checking stress test with his calcium score as well of 700.

## 2021-09-26 NOTE — Assessment & Plan Note (Signed)
Elevated calcium score 700. Agree Crestor 20. LDL goal < 70. ASA 81 With score and dyspnea checking NUC stress test ECHO as well.

## 2021-09-26 NOTE — Assessment & Plan Note (Signed)
40 mm seen on CT.  Continue to monitor.

## 2021-10-07 DIAGNOSIS — I2510 Atherosclerotic heart disease of native coronary artery without angina pectoris: Secondary | ICD-10-CM | POA: Diagnosis not present

## 2021-10-07 DIAGNOSIS — I2584 Coronary atherosclerosis due to calcified coronary lesion: Secondary | ICD-10-CM | POA: Diagnosis not present

## 2021-10-08 ENCOUNTER — Telehealth (HOSPITAL_COMMUNITY): Payer: Self-pay | Admitting: *Deleted

## 2021-10-08 DIAGNOSIS — I2510 Atherosclerotic heart disease of native coronary artery without angina pectoris: Secondary | ICD-10-CM | POA: Diagnosis not present

## 2021-10-08 DIAGNOSIS — I2584 Coronary atherosclerosis due to calcified coronary lesion: Secondary | ICD-10-CM | POA: Diagnosis not present

## 2021-10-08 NOTE — Telephone Encounter (Signed)
Patient's wife per DPR was given detailed instructions per Myocardial Perfusion Study Information Sheet for the test on 10/15/21 at 0715. Patient notified to arrive 15 minutes early and that it is imperative to arrive on time for appointment to keep from having the test rescheduled.  If you need to cancel or reschedule your appointment, please call the office within 24 hours of your appointment. . Patient verbalized understanding.Cyera Balboni, Ranae Palms

## 2021-10-15 ENCOUNTER — Ambulatory Visit (HOSPITAL_COMMUNITY): Payer: PPO | Attending: Cardiovascular Disease

## 2021-10-15 ENCOUNTER — Ambulatory Visit (HOSPITAL_BASED_OUTPATIENT_CLINIC_OR_DEPARTMENT_OTHER): Payer: PPO

## 2021-10-15 ENCOUNTER — Other Ambulatory Visit: Payer: Self-pay

## 2021-10-15 DIAGNOSIS — I2510 Atherosclerotic heart disease of native coronary artery without angina pectoris: Secondary | ICD-10-CM | POA: Diagnosis not present

## 2021-10-15 DIAGNOSIS — R0609 Other forms of dyspnea: Secondary | ICD-10-CM

## 2021-10-15 LAB — MYOCARDIAL PERFUSION IMAGING
Angina Index: 0
Duke Treadmill Score: 7
Estimated workload: 7
Exercise duration (min): 6 min
Exercise duration (sec): 47 s
LV dias vol: 85 mL (ref 62–150)
LV sys vol: 33 mL
MPHR: 149 {beats}/min
Nuc Stress EF: 61 %
Peak HR: 130 {beats}/min
Percent HR: 87 %
Rest HR: 67 {beats}/min
Rest Nuclear Isotope Dose: 10.1 mCi
SDS: 1
SRS: 0
SSS: 1
ST Depression (mm): 0 mm
Stress Nuclear Isotope Dose: 32.8 mCi
TID: 0.86

## 2021-10-15 LAB — ECHOCARDIOGRAM COMPLETE
Area-P 1/2: 3.59 cm2
S' Lateral: 2.6 cm
Weight: 3872 oz

## 2021-10-15 MED ORDER — REGADENOSON 0.4 MG/5ML IV SOLN: 0.4000 mg | Freq: Once | INTRAVENOUS | Status: AC

## 2021-10-15 MED ORDER — TECHNETIUM TC 99M TETROFOSMIN IV KIT
32.8000 | PACK | Freq: Once | INTRAVENOUS | Status: AC | PRN
  Administered 2021-10-15: 32.8 via INTRAVENOUS
  Filled 2021-10-15: qty 33

## 2021-10-15 MED ORDER — TECHNETIUM TC 99M TETROFOSMIN IV KIT
10.1000 | PACK | Freq: Once | INTRAVENOUS | Status: AC | PRN
  Administered 2021-10-15: 10.1 via INTRAVENOUS
  Filled 2021-10-15: qty 11

## 2021-10-22 ENCOUNTER — Encounter: Payer: Self-pay | Admitting: Cardiology

## 2021-10-23 ENCOUNTER — Telehealth: Payer: Self-pay | Admitting: *Deleted

## 2021-10-23 NOTE — Telephone Encounter (Signed)
Good Afternoon,   Dr Marlou Porch released your results to your MyChart and it says the results were viewed by you.  I will forward your request to his RN so she can call you to go over the results.  She is not in the office this afternoon but will contact you as soon as she returns.   Thank You,   Rosann Auerbach R,RN    Kent Hartman  [facility] [address] Triage (supporting Winston, Thana Farr, MD) 20 hours ago (12:40 PM)   I had my stress test and echo on 10/15/21 at [facility] and I was told I would get a call back with the results from your office. I was wondering if you had a chance to go over them yet.  Please call or email me with the results and if I need any follow up appointments, changes in meds or changes/limitations to exertion/exercise. Prior to my test it was recommended that I not over exert.  Regards Kent Hartman results: Excellent stress test.  No evidence of ischemia or infarction.  Normal pump function. Candee Furbish, MD   Echo results:  Normal pump function.  Mildly dilated atria.  Mild dilation of ascending aorta 42 mm Candee Furbish, MD  Called and spoke with pt.  Reviewed results as above.  Advised mild dilation of ascending aorta will be monitored generally once yearly.  Dr Marlou Porch will determine when he will need to recheck at his 1 yr f/u.  Advised to continue to work on management of HTN and wt loss.  Pt states understanding and will call back if any questions/concerns.

## 2021-11-12 DIAGNOSIS — H401122 Primary open-angle glaucoma, left eye, moderate stage: Secondary | ICD-10-CM | POA: Diagnosis not present

## 2021-11-12 DIAGNOSIS — H43812 Vitreous degeneration, left eye: Secondary | ICD-10-CM | POA: Diagnosis not present

## 2021-11-12 DIAGNOSIS — H5203 Hypermetropia, bilateral: Secondary | ICD-10-CM | POA: Diagnosis not present

## 2021-11-12 DIAGNOSIS — H2511 Age-related nuclear cataract, right eye: Secondary | ICD-10-CM | POA: Diagnosis not present

## 2021-11-12 DIAGNOSIS — H25812 Combined forms of age-related cataract, left eye: Secondary | ICD-10-CM | POA: Diagnosis not present

## 2021-11-23 IMAGING — CT CT CARDIAC CORONARY ARTERY CALCIUM SCORE
3 series · 14 of 20 positions shown, 16 images · non-contrast
Comparison: CT of the chest on 09/04/2003

CLINICAL DATA: 69-year-old Caucasian male with history of
hyperlipidemia and hypertension.

EXAM:
CT CARDIAC CORONARY ARTERY CALCIUM SCORE
TECHNIQUE: Non-contrast imaging through the heart was performed using
prospective ECG gating. Image post processing was performed on an
independent workstation, allowing for quantitative analysis of the
heart and coronary arteries. Note that this exam targets the heart
and the chest was not imaged in its entirety.

[Series 2: calcium scoring 2.00 qr36 bestdiast 71% hrt calciu · axial · 0.47mm/px · z∈[+1576,+1672]mm · 4 of 80 slices shown]
[im 16/80  vessel]
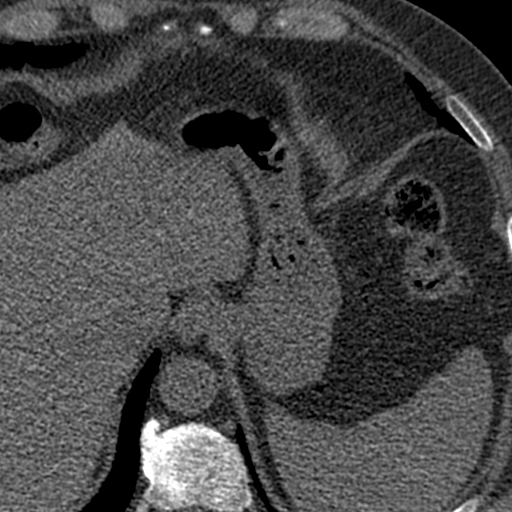
[im 32/80  vessel]
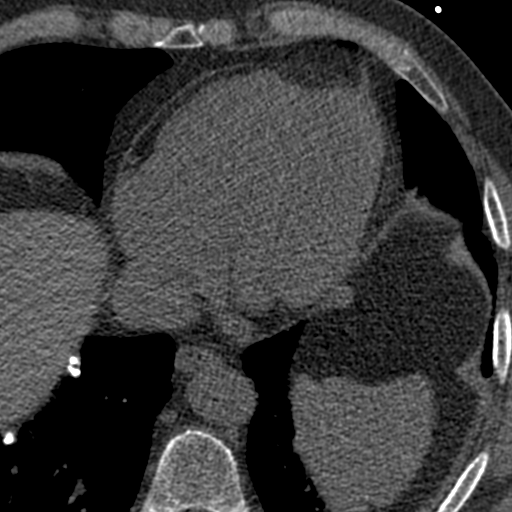
[im 48/80  vessel]
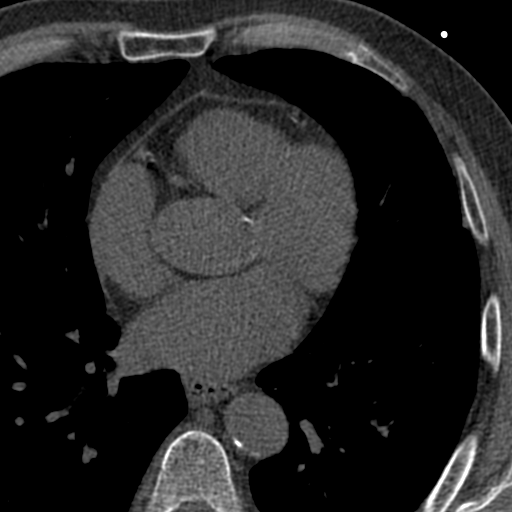
[im 64/80  vessel]
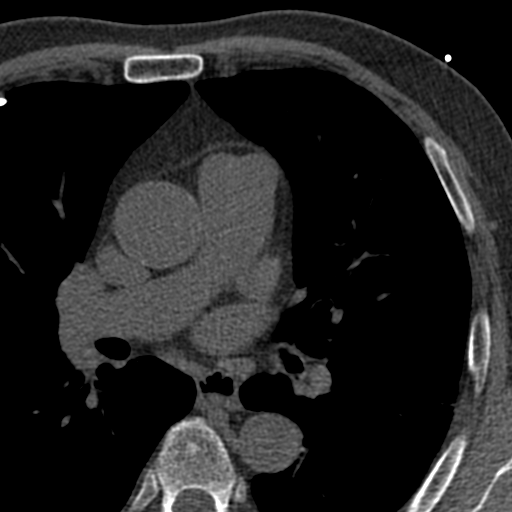

[Series 3: calcium scoring 2.00 br40 bestdiast 71% axial · axial · 0.64mm/px · z∈[+1572,+1676]mm · 5 of 80 slices shown, 7 images]
[im 14/80  vessel]
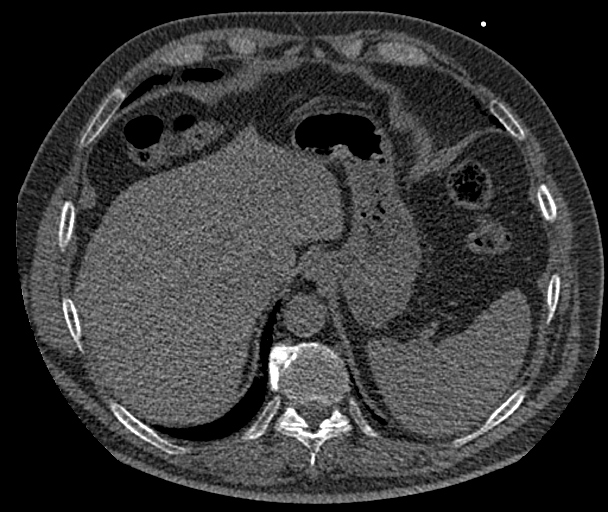
[im 14/80  lung]
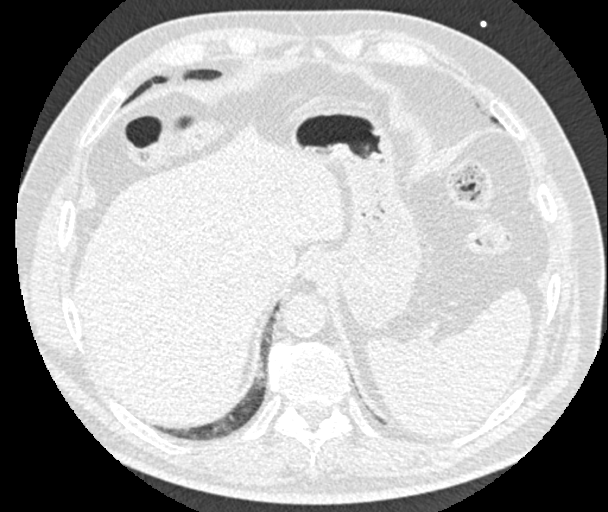
[im 27/80  vessel]
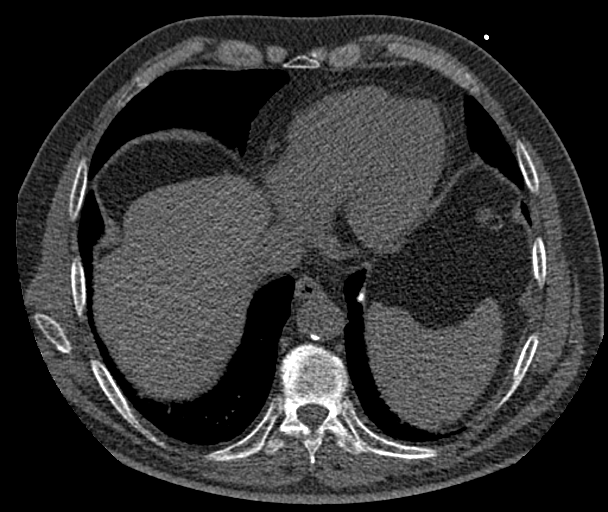
[im 40/80  vessel]
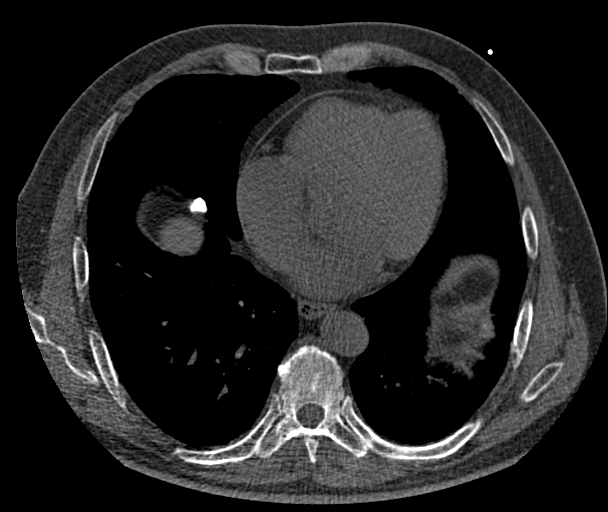
[im 53/80  vessel]
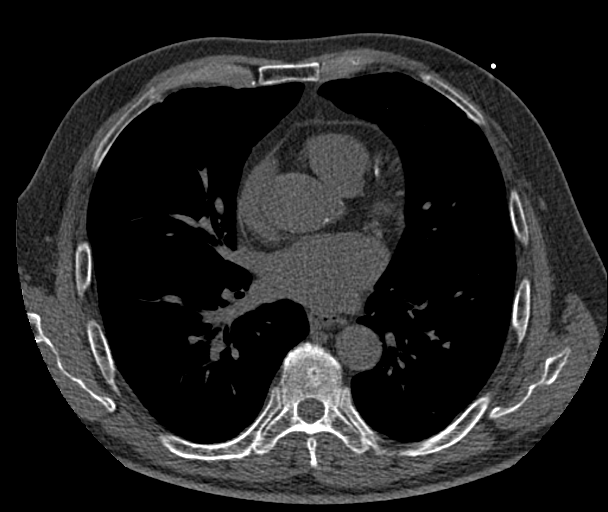
[im 66/80  vessel]
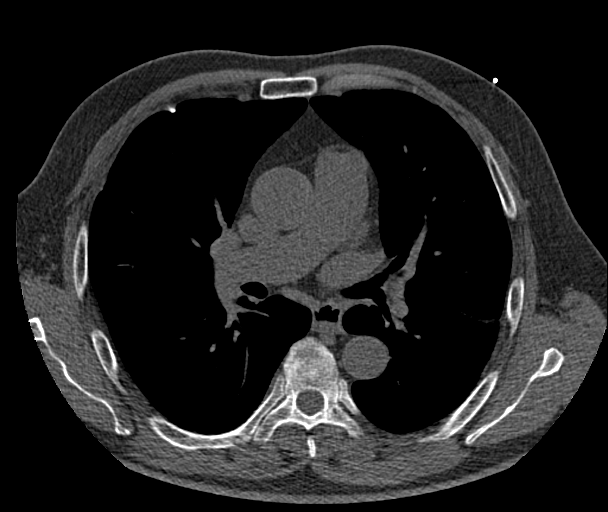
[im 66/80  lung]
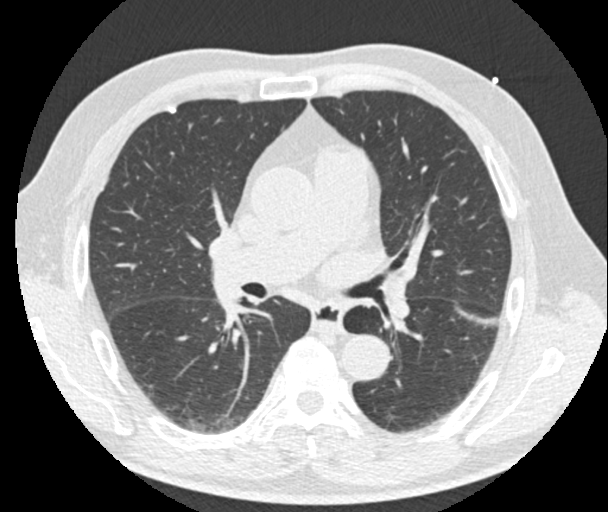

[Series 9: calcium scoring 2.00 br60 bestdiast 71% lungs · axial · 0.64mm/px · z∈[+1572,+1676]mm · 5 of 80 slices shown]
[im 14/80  vessel]
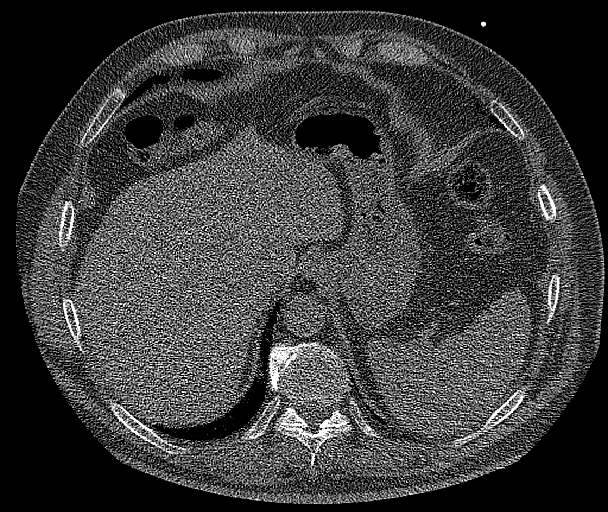
[im 27/80  vessel]
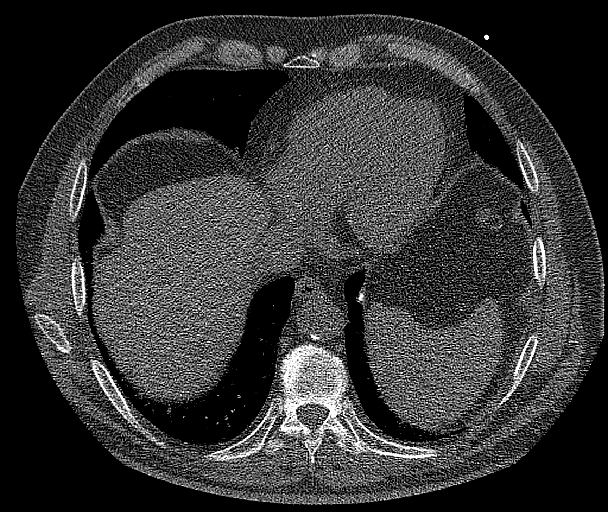
[im 40/80  vessel]
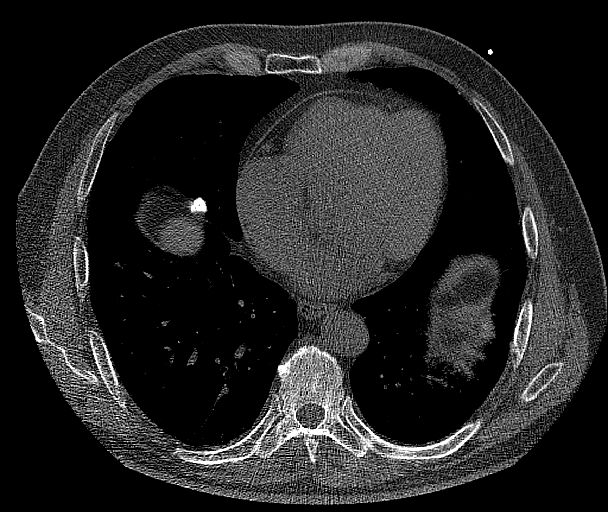
[im 53/80  vessel]
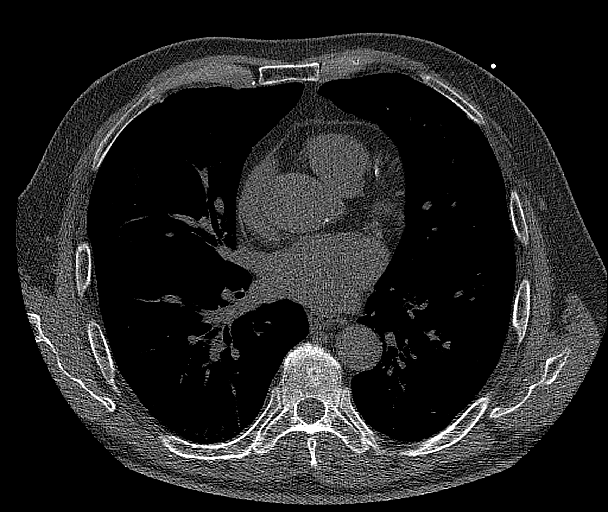
[im 66/80  vessel]
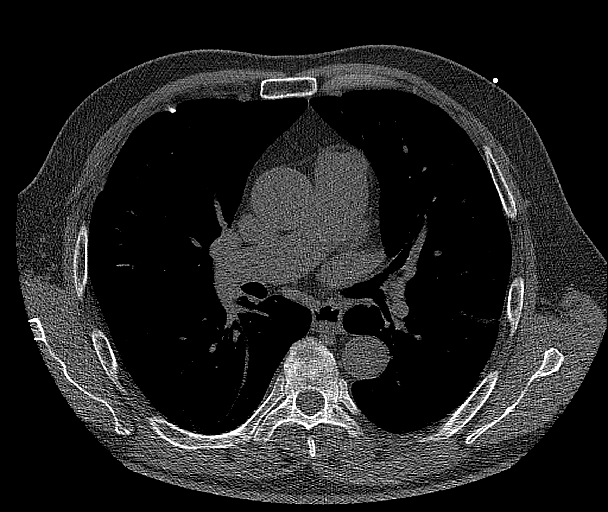

[14 of 20 positions shown; findings below may reference images not displayed]

FINDINGS: CORONARY CALCIUM SCORES:

Left Main:

LAD: 255

LCx:

RCA: 442

Total Agatston Score: 720

[HOSPITAL] percentile: 80

AORTA MEASUREMENTS:

Ascending Aorta: 40 mm

Descending Aorta: 28 mm

OTHER FINDINGS:

The heart size is within normal limits. No pericardial fluid is
identified. Visualized ascending thoracic aorta demonstrates mild
dilatation measuring up to approximately 4 cm in maximum diameter.
There is some associated calcification at the level of the aortic
valve. The central pulmonary arteries are dilated with the main
pulmonary artery measuring 3.7 cm. Visualized mediastinum and hilar
regions demonstrate no lymphadenopathy or masses. Stable calcified
pleural plaques bilaterally. Visualized lungs show no evidence of
pulmonary edema, consolidation, pneumothorax, nodule or pleural
fluid. Stable calcification in the posterior right lobe of the
liver. Visualized bony structures are unremarkable.
IMPRESSION: 1. Coronary calcium score 720 is at the 80th percentile for the
patient's age, sex and race.
2. Mild aneurysmal disease of the ascending thoracic aorta measuring
up to approximately 4 cm in maximum diameter. Associated
calcification of the aortic valve. Correlation with echocardiography
may be helpful. Recommend annual imaging followup by CTA or MRA.
This recommendation follows 2535
ACCF/AHA/AATS/ACR/ASA/SCA/JAN-GREG/NEGRET/ARNILA/SOU Guidelines for the
Diagnosis and Management of Patients with Thoracic Aortic Disease.
Circulation. 2535; 121: E266-e369. Aortic aneurysm NOS ([id number])
3. Stable bilateral calcified pleural plaques.

## 2022-07-28 ENCOUNTER — Other Ambulatory Visit: Payer: Self-pay | Admitting: Urology

## 2022-07-28 DIAGNOSIS — R030 Elevated blood-pressure reading, without diagnosis of hypertension: Secondary | ICD-10-CM | POA: Diagnosis not present

## 2022-07-28 DIAGNOSIS — Z125 Encounter for screening for malignant neoplasm of prostate: Secondary | ICD-10-CM | POA: Diagnosis not present

## 2022-07-28 DIAGNOSIS — R739 Hyperglycemia, unspecified: Secondary | ICD-10-CM | POA: Diagnosis not present

## 2022-07-28 DIAGNOSIS — N202 Calculus of kidney with calculus of ureter: Secondary | ICD-10-CM | POA: Diagnosis not present

## 2022-07-31 ENCOUNTER — Other Ambulatory Visit: Payer: Self-pay | Admitting: Internal Medicine

## 2022-07-31 ENCOUNTER — Encounter (HOSPITAL_BASED_OUTPATIENT_CLINIC_OR_DEPARTMENT_OTHER): Payer: Self-pay | Admitting: Urology

## 2022-07-31 DIAGNOSIS — I700 Atherosclerosis of aorta: Secondary | ICD-10-CM | POA: Diagnosis not present

## 2022-07-31 DIAGNOSIS — R5383 Other fatigue: Secondary | ICD-10-CM | POA: Diagnosis not present

## 2022-07-31 DIAGNOSIS — I10 Essential (primary) hypertension: Secondary | ICD-10-CM | POA: Diagnosis not present

## 2022-07-31 DIAGNOSIS — N200 Calculus of kidney: Secondary | ICD-10-CM | POA: Diagnosis not present

## 2022-07-31 DIAGNOSIS — Z0000 Encounter for general adult medical examination without abnormal findings: Secondary | ICD-10-CM | POA: Diagnosis not present

## 2022-07-31 DIAGNOSIS — I7121 Aneurysm of the ascending aorta, without rupture: Secondary | ICD-10-CM | POA: Diagnosis not present

## 2022-07-31 DIAGNOSIS — I2510 Atherosclerotic heart disease of native coronary artery without angina pectoris: Secondary | ICD-10-CM | POA: Diagnosis not present

## 2022-07-31 DIAGNOSIS — Z23 Encounter for immunization: Secondary | ICD-10-CM | POA: Diagnosis not present

## 2022-07-31 DIAGNOSIS — R7303 Prediabetes: Secondary | ICD-10-CM | POA: Diagnosis not present

## 2022-07-31 DIAGNOSIS — G4700 Insomnia, unspecified: Secondary | ICD-10-CM | POA: Diagnosis not present

## 2022-07-31 DIAGNOSIS — J4520 Mild intermittent asthma, uncomplicated: Secondary | ICD-10-CM | POA: Diagnosis not present

## 2022-07-31 DIAGNOSIS — R7989 Other specified abnormal findings of blood chemistry: Secondary | ICD-10-CM | POA: Diagnosis not present

## 2022-07-31 NOTE — Progress Notes (Signed)
Talked with patient and wife . Instructions given. Arrival time 52. Wife is the driver. Hx and meds reviewed

## 2022-08-03 ENCOUNTER — Encounter (HOSPITAL_BASED_OUTPATIENT_CLINIC_OR_DEPARTMENT_OTHER)
Admission: RE | Disposition: A | Discharge status: Home / Self Care | Payer: Self-pay | Source: Home / Self Care | Attending: Urology

## 2022-08-03 ENCOUNTER — Other Ambulatory Visit: Payer: Self-pay

## 2022-08-03 ENCOUNTER — Encounter (HOSPITAL_BASED_OUTPATIENT_CLINIC_OR_DEPARTMENT_OTHER): Payer: Self-pay | Admitting: Urology

## 2022-08-03 ENCOUNTER — Ambulatory Visit (HOSPITAL_BASED_OUTPATIENT_CLINIC_OR_DEPARTMENT_OTHER)
Admission: RE | Admit: 2022-08-03 | Discharge: 2022-08-03 | Disposition: A | Discharge status: Home / Self Care | Payer: PPO | Attending: Urology | Admitting: Urology

## 2022-08-03 ENCOUNTER — Ambulatory Visit (HOSPITAL_COMMUNITY): Payer: PPO

## 2022-08-03 DIAGNOSIS — I10 Essential (primary) hypertension: Secondary | ICD-10-CM | POA: Diagnosis not present

## 2022-08-03 DIAGNOSIS — Z6832 Body mass index (BMI) 32.0-32.9, adult: Secondary | ICD-10-CM | POA: Insufficient documentation

## 2022-08-03 DIAGNOSIS — E669 Obesity, unspecified: Secondary | ICD-10-CM | POA: Diagnosis not present

## 2022-08-03 DIAGNOSIS — I878 Other specified disorders of veins: Secondary | ICD-10-CM | POA: Diagnosis not present

## 2022-08-03 DIAGNOSIS — N201 Calculus of ureter: Secondary | ICD-10-CM | POA: Insufficient documentation

## 2022-08-03 DIAGNOSIS — R5383 Other fatigue: Secondary | ICD-10-CM | POA: Diagnosis not present

## 2022-08-03 HISTORY — PX: EXTRACORPOREAL SHOCK WAVE LITHOTRIPSY: SHX1557

## 2022-08-03 HISTORY — DX: Prediabetes: R73.03

## 2022-08-03 SURGERY — LITHOTRIPSY, ESWL
Anesthesia: LOCAL | Laterality: Left

## 2022-08-03 MED ORDER — OXYCODONE-ACETAMINOPHEN 5-325 MG PO TABS: 1.0000 | ORAL_TABLET | ORAL | 0 refills | Status: DC | PRN

## 2022-08-03 MED ORDER — TAMSULOSIN HCL 0.4 MG PO CAPS: 0.4000 mg | ORAL_CAPSULE | Freq: Every day | ORAL | 0 refills | Status: DC

## 2022-08-03 MED ORDER — SODIUM CHLORIDE 0.9 % IV SOLN: INTRAVENOUS | Status: DC

## 2022-08-03 MED ORDER — DIPHENHYDRAMINE HCL 25 MG PO CAPS
25.0000 mg | ORAL_CAPSULE | ORAL | Status: AC
  Administered 2022-08-03: 25 mg via ORAL

## 2022-08-03 MED ORDER — DIPHENHYDRAMINE HCL 25 MG PO CAPS
ORAL_CAPSULE | ORAL | Status: AC
  Filled 2022-08-03: qty 1

## 2022-08-03 MED ORDER — CIPROFLOXACIN HCL 500 MG PO TABS
500.0000 mg | ORAL_TABLET | ORAL | Status: AC
  Administered 2022-08-03: 500 mg via ORAL

## 2022-08-03 MED ORDER — CIPROFLOXACIN HCL 500 MG PO TABS
ORAL_TABLET | ORAL | Status: AC
  Filled 2022-08-03: qty 1

## 2022-08-03 MED ORDER — DIPHENHYDRAMINE HCL 25 MG PO TABS: 50.0000 mg | ORAL_TABLET | Freq: Every evening | ORAL | 0 refills | Status: DC | PRN

## 2022-08-03 MED ORDER — DIAZEPAM 5 MG PO TABS
10.0000 mg | ORAL_TABLET | ORAL | Status: AC
  Administered 2022-08-03: 10 mg via ORAL

## 2022-08-03 MED ORDER — DIAZEPAM 5 MG PO TABS
ORAL_TABLET | ORAL | Status: AC
  Filled 2022-08-03: qty 2

## 2022-08-03 NOTE — H&P (Signed)
H&P  History of Present Illness: Kent Hartman is a 71 y.o. year old with a left UPJ stone who presents for ESWL  Past Medical History:  Diagnosis Date   Calculus, kidney    Cellulitis and abscess of left lower extremity    Coronary atherosclerosis    Disorder of lipoprotein metabolism    Hard of hearing    Hematuria    HLD (hyperlipidemia)    Hyperglycemia    Insomnia    Kidney stones    MRSA (methicillin resistant Staphylococcus aureus)    Nephrolithiasis    Pleural plaque without asbestos    Pre-diabetes    Prediabetes     Past Surgical History:  Procedure Laterality Date   COLONOSCOPY      Home Medications:  Current Meds  Medication Sig   bisacodyl (DULCOLAX) 5 MG EC tablet Take 5 mg by mouth daily.   Cholecalciferol (VITAMIN D3) 25 MCG (1000 UT) CAPS Take 1,000 Int'l Units by mouth daily.   latanoprost (XALATAN) 0.005 % ophthalmic solution 1 drop at bedtime.   losartan (COZAAR) 50 MG tablet Take 50 mg by mouth daily.   MAGNESIUM ASPARTATE PO Take by mouth.   melatonin 5 MG TABS Take 5 mg by mouth daily.   Multiple Vitamins-Minerals (CENTRUM SILVER PO) Take 1 tablet by mouth daily.   Pyridoxine HCl (VITAMIN B6) 100 MG TABS Take 100 mg by mouth daily.   rosuvastatin (CRESTOR) 20 MG tablet Take 20 mg by mouth daily.   vitamin C (ASCORBIC ACID) 500 MG tablet Take 500 mg by mouth daily.    Allergies: No Known Allergies  Family History  Problem Relation Age of Onset   Rheum arthritis Mother    Breast cancer Sister    Pancreatic cancer Brother     Social History:  reports that he has never smoked. He has never used smokeless tobacco. He reports that he does not drink alcohol and does not use drugs.  ROS: A complete review of systems was performed.  All systems are negative except for pertinent findings as noted.  Physical Exam:  Vital signs in last 24 hours: Temp:  [97.9 F (36.6 C)] 97.9 F (36.6 C) (10/30 0940) Pulse Rate:  [81] 81 (10/30 0940) Resp:   [17] 17 (10/30 0940) BP: (126)/(79) 126/79 (10/30 0940) SpO2:  [97 %] 97 % (10/30 0940) Weight:  [108.9 kg] 108.9 kg (10/30 0940) Constitutional:  Alert and oriented, No acute distress Cardiovascular: Regular rate and rhythm, No JVD Respiratory: Normal respiratory effort, Lungs clear bilaterally GI: Abdomen is soft, nontender, nondistended, no abdominal masses GU: No CVA tenderness Lymphatic: No lymphadenopathy Neurologic: Grossly intact, no focal deficits Psychiatric: Normal mood and affect Drains/Tubes: none   Laboratory Data:  No results for input(s): "WBC", "HGB", "HCT", "PLT" in the last 72 hours.  No results for input(s): "NA", "K", "CL", "GLUCOSE", "BUN", "CALCIUM", "CREATININE" in the last 72 hours.  Invalid input(s): "CO3"   No results found for this or any previous visit (from the past 24 hour(s)). No results found for this or any previous visit (from the past 240 hour(s)).  Renal Function: No results for input(s): "CREATININE" in the last 168 hours. CrCl cannot be calculated (Patient's most recent lab result is older than the maximum 21 days allowed.).  Radiologic Imaging: No results found.  Assessment:  72 yo M with left 1.2 cm proximal stone  Plan:  To OR for L ESWL  Donald Pore, MD 08/03/2022, 10:07 AM  [facility]  Pager: [phone]

## 2022-08-03 NOTE — Discharge Instructions (Addendum)
1. You should strain your urine and collect all fragments and bring them to your follow up appointment.  2. You should take your pain medication as needed.  Please call if your pain is severe to the point that it is not controlled with your pain medication. 3. You should call if you develop fever > 101 or persistent nausea or vomiting. 4. Your doctor may prescribe tamsulosin to take to help facilitate stone passage.   Post Anesthesia Home Care Instructions  Activity: Get plenty of rest for the remainder of the day. A responsible individual must stay with you for 24 hours following the procedure.  For the next 24 hours, DO NOT: -Drive a car -Paediatric nurse -Drink alcoholic beverages -Take any medication unless instructed by your physician -Make any legal decisions or sign important papers.  Meals: Start with liquid foods such as gelatin or soup. Progress to regular foods as tolerated. Avoid greasy, spicy, heavy foods. If nausea and/or vomiting occur, drink only clear liquids until the nausea and/or vomiting subsides. Call your physician if vomiting continues.

## 2022-08-03 NOTE — Op Note (Signed)
See [employer] operative note scanned into chart. Also because of the size, density, location and other factors that cannot be anticipated I feel this will likely be a staged procedure. This fact supersedes any indication in the scanned [place] stone operative note to the contrary.  Donald Pore MD 08/03/2022, 12:47 PM  [facility]  Pager: [phone]

## 2022-08-04 ENCOUNTER — Encounter (HOSPITAL_BASED_OUTPATIENT_CLINIC_OR_DEPARTMENT_OTHER): Payer: Self-pay | Admitting: Urology

## 2022-08-12 DIAGNOSIS — N1831 Chronic kidney disease, stage 3a: Secondary | ICD-10-CM | POA: Diagnosis not present

## 2022-08-12 DIAGNOSIS — R7303 Prediabetes: Secondary | ICD-10-CM | POA: Diagnosis not present

## 2022-08-12 DIAGNOSIS — I10 Essential (primary) hypertension: Secondary | ICD-10-CM | POA: Diagnosis not present

## 2022-08-19 DIAGNOSIS — N202 Calculus of kidney with calculus of ureter: Secondary | ICD-10-CM | POA: Diagnosis not present

## 2022-08-20 ENCOUNTER — Other Ambulatory Visit: Payer: Self-pay | Admitting: Urology

## 2022-08-26 ENCOUNTER — Encounter (HOSPITAL_BASED_OUTPATIENT_CLINIC_OR_DEPARTMENT_OTHER): Payer: Self-pay | Admitting: Urology

## 2022-08-26 NOTE — Progress Notes (Signed)
Spoke w/ via phone for pre-op interview--- pt Lab needs dos----  [employer] results------ current EKG in epic/ chart COVID test -----patient states asymptomatic no test needed Arrive at -------  0530 on 09-02-2022 NPO after MN NO Solid Food.  Clear liquids from MN until--- 0430 Med rec completed Medications to take morning of surgery ----- none Diabetic medication ----- n/a Patient instructed no nail polish to be worn day of surgery Patient instructed to bring photo id and insurance card day of surgery Patient aware to have Driver (ride ) / caregiver for 24 hours after surgery --- wife, susan Patient Special Instructions ----- asked to bring rescue inhaler dos Pre-Op special Istructions ----- n/a Patient verbalized understanding of instructions that were given at this phone interview. Patient denies shortness of breath, chest pain, fever, cough at this phone interview.

## 2022-09-01 NOTE — Anesthesia Preprocedure Evaluation (Addendum)
Anesthesia Evaluation  Patient identified by MRN, date of birth, ID band Patient awake    Reviewed: Allergy & Precautions, NPO status , Patient's Chart, lab work & pertinent test results  Airway Mallampati: II  TM Distance: >3 FB Neck ROM: Full    Dental no notable dental hx.    Pulmonary neg pulmonary ROS   Pulmonary exam normal        Cardiovascular hypertension, Pt. on medications + CAD   Rhythm:Regular Rate:Normal  Thoracic aneurysm (4.2cm, followed by cards)   Neuro/Psych negative neurological ROS  negative psych ROS   GI/Hepatic negative GI ROS, Neg liver ROS,,,  Endo/Other  negative endocrine ROS    Renal/GU Renal diseaseLeft ureteral stone  negative genitourinary   Musculoskeletal negative musculoskeletal ROS (+)    Abdominal Normal abdominal exam  (+)   Peds  Hematology negative hematology ROS (+)   Anesthesia Other Findings   Reproductive/Obstetrics                             Anesthesia Physical Anesthesia Plan  ASA: 3  Anesthesia Plan: General   Post-op Pain Management: Celebrex PO (pre-op)* and Tylenol PO (pre-op)*   Induction: Intravenous  PONV Risk Score and Plan: 2 and Ondansetron, Dexamethasone, Treatment may vary due to age or medical condition and Midazolam  Airway Management Planned: Mask and LMA  Additional Equipment: None  Intra-op Plan:   Post-operative Plan: Extubation in OR  Informed Consent: I have reviewed the patients History and Physical, chart, labs and discussed the procedure including the risks, benefits and alternatives for the proposed anesthesia with the patient or authorized representative who has indicated his/her understanding and acceptance.     Dental advisory given  Plan Discussed with: CRNA  Anesthesia Plan Comments:        Anesthesia Quick Evaluation

## 2022-09-02 ENCOUNTER — Encounter (HOSPITAL_COMMUNITY): Payer: Self-pay | Admitting: Emergency Medicine

## 2022-09-02 ENCOUNTER — Emergency Department (HOSPITAL_COMMUNITY)
Admission: EM | Admit: 2022-09-02 | Discharge: 2022-09-02 | Disposition: A | Discharge status: Home / Self Care | Payer: PPO | Source: Home / Self Care | Attending: Emergency Medicine | Admitting: Emergency Medicine

## 2022-09-02 ENCOUNTER — Encounter (HOSPITAL_BASED_OUTPATIENT_CLINIC_OR_DEPARTMENT_OTHER): Payer: Self-pay | Admitting: Urology

## 2022-09-02 ENCOUNTER — Encounter (HOSPITAL_BASED_OUTPATIENT_CLINIC_OR_DEPARTMENT_OTHER)
Admission: RE | Disposition: A | Discharge status: Home / Self Care | Payer: Self-pay | Source: Home / Self Care | Attending: Urology

## 2022-09-02 ENCOUNTER — Ambulatory Visit (HOSPITAL_BASED_OUTPATIENT_CLINIC_OR_DEPARTMENT_OTHER): Payer: PPO | Admitting: Anesthesiology

## 2022-09-02 ENCOUNTER — Ambulatory Visit (HOSPITAL_BASED_OUTPATIENT_CLINIC_OR_DEPARTMENT_OTHER)
Admission: RE | Admit: 2022-09-02 | Discharge: 2022-09-02 | Disposition: A | Discharge status: Home / Self Care | Payer: PPO | Attending: Urology | Admitting: Urology

## 2022-09-02 ENCOUNTER — Other Ambulatory Visit: Payer: Self-pay

## 2022-09-02 DIAGNOSIS — N201 Calculus of ureter: Secondary | ICD-10-CM | POA: Diagnosis not present

## 2022-09-02 DIAGNOSIS — Z7982 Long term (current) use of aspirin: Secondary | ICD-10-CM | POA: Insufficient documentation

## 2022-09-02 DIAGNOSIS — Z01818 Encounter for other preprocedural examination: Secondary | ICD-10-CM

## 2022-09-02 DIAGNOSIS — N202 Calculus of kidney with calculus of ureter: Secondary | ICD-10-CM | POA: Diagnosis present

## 2022-09-02 DIAGNOSIS — Z79899 Other long term (current) drug therapy: Secondary | ICD-10-CM | POA: Insufficient documentation

## 2022-09-02 DIAGNOSIS — R339 Retention of urine, unspecified: Secondary | ICD-10-CM | POA: Insufficient documentation

## 2022-09-02 DIAGNOSIS — N289 Disorder of kidney and ureter, unspecified: Secondary | ICD-10-CM | POA: Diagnosis not present

## 2022-09-02 DIAGNOSIS — N200 Calculus of kidney: Secondary | ICD-10-CM

## 2022-09-02 DIAGNOSIS — I2510 Atherosclerotic heart disease of native coronary artery without angina pectoris: Secondary | ICD-10-CM | POA: Insufficient documentation

## 2022-09-02 DIAGNOSIS — I10 Essential (primary) hypertension: Secondary | ICD-10-CM | POA: Insufficient documentation

## 2022-09-02 HISTORY — DX: Aneurysm of the ascending aorta, without rupture: I71.21

## 2022-09-02 HISTORY — PX: CYSTOSCOPY/URETEROSCOPY/HOLMIUM LASER/STENT PLACEMENT: SHX6546

## 2022-09-02 HISTORY — DX: Calculus of ureter: N20.1

## 2022-09-02 HISTORY — DX: Personal history of Methicillin resistant Staphylococcus aureus infection: Z86.14

## 2022-09-02 HISTORY — DX: Atherosclerotic heart disease of native coronary artery without angina pectoris: I25.10

## 2022-09-02 HISTORY — DX: Hyperlipidemia, unspecified: E78.5

## 2022-09-02 HISTORY — DX: Pleural plaque with presence of asbestos: J92.0

## 2022-09-02 HISTORY — DX: Unspecified glaucoma: H40.9

## 2022-09-02 LAB — CBC WITH DIFFERENTIAL/PLATELET
Abs Immature Granulocytes: 0.02 10*3/uL (ref 0.00–0.07)
Basophils Absolute: 0 10*3/uL (ref 0.0–0.1)
Basophils Relative: 0 %
Eosinophils Absolute: 0 10*3/uL (ref 0.0–0.5)
Eosinophils Relative: 0 %
HCT: 39.5 % (ref 39.0–52.0)
Hemoglobin: 13.2 g/dL (ref 13.0–17.0)
Immature Granulocytes: 0 %
Lymphocytes Relative: 5 %
Lymphs Abs: 0.4 10*3/uL — ABNORMAL LOW (ref 0.7–4.0)
MCH: 31 pg (ref 26.0–34.0)
MCHC: 33.4 g/dL (ref 30.0–36.0)
MCV: 92.7 fL (ref 80.0–100.0)
Monocytes Absolute: 0 10*3/uL — ABNORMAL LOW (ref 0.1–1.0)
Monocytes Relative: 0 %
Neutro Abs: 6.2 10*3/uL (ref 1.7–7.7)
Neutrophils Relative %: 95 %
Platelets: 144 10*3/uL — ABNORMAL LOW (ref 150–400)
RBC: 4.26 MIL/uL (ref 4.22–5.81)
RDW: 13 % (ref 11.5–15.5)
WBC: 6.6 10*3/uL (ref 4.0–10.5)
nRBC: 0 % (ref 0.0–0.2)

## 2022-09-02 LAB — COMPREHENSIVE METABOLIC PANEL
ALT: 34 U/L (ref 0–44)
AST: 26 U/L (ref 15–41)
Albumin: 4.1 g/dL (ref 3.5–5.0)
Alkaline Phosphatase: 53 U/L (ref 38–126)
Anion gap: 8 (ref 5–15)
BUN: 21 mg/dL (ref 8–23)
CO2: 23 mmol/L (ref 22–32)
Calcium: 8.7 mg/dL — ABNORMAL LOW (ref 8.9–10.3)
Chloride: 104 mmol/L (ref 98–111)
Creatinine, Ser: 1.55 mg/dL — ABNORMAL HIGH (ref 0.61–1.24)
GFR, Estimated: 48 mL/min — ABNORMAL LOW (ref >60)
Glucose, Bld: 211 mg/dL — ABNORMAL HIGH (ref 70–99)
Potassium: 4.1 mmol/L (ref 3.5–5.1)
Sodium: 135 mmol/L (ref 135–145)
Total Bilirubin: 1.8 mg/dL — ABNORMAL HIGH (ref 0.3–1.2)
Total Protein: 7 g/dL (ref 6.5–8.1)

## 2022-09-02 LAB — URINALYSIS, ROUTINE W REFLEX MICROSCOPIC
Bilirubin Urine: NEGATIVE
Glucose, UA: NEGATIVE mg/dL
Ketones, ur: NEGATIVE mg/dL
Nitrite: NEGATIVE
Protein, ur: 100 mg/dL — AB
RBC / HPF: >50 RBC/hpf — ABNORMAL HIGH (ref 0–5)
Specific Gravity, Urine: <=1.005 (ref 1.005–1.030)
pH: 6.5 (ref 5.0–8.0)

## 2022-09-02 LAB — POCT I-STAT, CHEM 8
BUN: 18 mg/dL (ref 8–23)
Calcium, Ion: 1.23 mmol/L (ref 1.15–1.40)
Chloride: 106 mmol/L (ref 98–111)
Creatinine, Ser: 1.2 mg/dL (ref 0.61–1.24)
Glucose, Bld: 138 mg/dL — ABNORMAL HIGH (ref 70–99)
HCT: 39 % (ref 39.0–52.0)
Hemoglobin: 13.3 g/dL (ref 13.0–17.0)
Potassium: 3.7 mmol/L (ref 3.5–5.1)
Sodium: 140 mmol/L (ref 135–145)
TCO2: 23 mmol/L (ref 22–32)

## 2022-09-02 SURGERY — CYSTOSCOPY/URETEROSCOPY/HOLMIUM LASER/STENT PLACEMENT
Anesthesia: General | Site: Renal | Laterality: Left

## 2022-09-02 MED ORDER — PHENYLEPHRINE HCL (PRESSORS) 10 MG/ML IV SOLN
INTRAVENOUS | Status: DC | PRN
  Administered 2022-09-02 (×10): 80 ug via INTRAVENOUS

## 2022-09-02 MED ORDER — OXYCODONE HCL 5 MG PO TABS: 5.0000 mg | ORAL_TABLET | Freq: Once | ORAL | Status: DC | PRN

## 2022-09-02 MED ORDER — PROPOFOL 10 MG/ML IV BOLUS
INTRAVENOUS | Status: AC
  Filled 2022-09-02: qty 20

## 2022-09-02 MED ORDER — CELECOXIB 200 MG PO CAPS
ORAL_CAPSULE | ORAL | Status: AC
  Filled 2022-09-02: qty 1

## 2022-09-02 MED ORDER — PHENYLEPHRINE 80 MCG/ML (10ML) SYRINGE FOR IV PUSH (FOR BLOOD PRESSURE SUPPORT)
PREFILLED_SYRINGE | INTRAVENOUS | Status: AC
  Filled 2022-09-02: qty 10

## 2022-09-02 MED ORDER — FENTANYL CITRATE (PF) 100 MCG/2ML IJ SOLN
INTRAMUSCULAR | Status: DC | PRN
  Administered 2022-09-02 (×4): 25 ug via INTRAVENOUS
  Administered 2022-09-02: 50 ug via INTRAVENOUS
  Administered 2022-09-02 (×2): 25 ug via INTRAVENOUS

## 2022-09-02 MED ORDER — OXYCODONE HCL 5 MG/5ML PO SOLN: 5.0000 mg | Freq: Once | ORAL | Status: DC | PRN

## 2022-09-02 MED ORDER — PHENYLEPHRINE HCL-NACL 20-0.9 MG/250ML-% IV SOLN
INTRAVENOUS | Status: DC | PRN
  Administered 2022-09-02: 40 ug/min via INTRAVENOUS

## 2022-09-02 MED ORDER — EPHEDRINE SULFATE (PRESSORS) 50 MG/ML IJ SOLN
INTRAMUSCULAR | Status: DC | PRN
  Administered 2022-09-02 (×4): 10 mg via INTRAVENOUS

## 2022-09-02 MED ORDER — EPHEDRINE 5 MG/ML INJ
INTRAVENOUS | Status: AC
  Filled 2022-09-02: qty 5

## 2022-09-02 MED ORDER — LIDOCAINE HCL URETHRAL/MUCOSAL 2 % EX GEL
CUTANEOUS | Status: DC | PRN
  Administered 2022-09-02: 1 via URETHRAL

## 2022-09-02 MED ORDER — TAMSULOSIN HCL 0.4 MG PO CAPS: 0.4000 mg | ORAL_CAPSULE | Freq: Every day | ORAL | 0 refills | Status: DC

## 2022-09-02 MED ORDER — CEFAZOLIN SODIUM-DEXTROSE 2-4 GM/100ML-% IV SOLN
2.0000 g | INTRAVENOUS | Status: AC
  Administered 2022-09-02: 2 g via INTRAVENOUS

## 2022-09-02 MED ORDER — FENTANYL CITRATE (PF) 100 MCG/2ML IJ SOLN
INTRAMUSCULAR | Status: AC
  Filled 2022-09-02: qty 2

## 2022-09-02 MED ORDER — PROPOFOL 10 MG/ML IV BOLUS
INTRAVENOUS | Status: DC | PRN
  Administered 2022-09-02: 200 mg via INTRAVENOUS
  Administered 2022-09-02: 50 mg via INTRAVENOUS

## 2022-09-02 MED ORDER — CEFAZOLIN SODIUM-DEXTROSE 2-4 GM/100ML-% IV SOLN
INTRAVENOUS | Status: AC
  Filled 2022-09-02: qty 100

## 2022-09-02 MED ORDER — LACTATED RINGERS IV SOLN: INTRAVENOUS | Status: DC

## 2022-09-02 MED ORDER — GLYCOPYRROLATE PF 0.2 MG/ML IJ SOSY
PREFILLED_SYRINGE | INTRAMUSCULAR | Status: AC
  Filled 2022-09-02: qty 1

## 2022-09-02 MED ORDER — SODIUM CHLORIDE 0.9 % IR SOLN
Status: DC | PRN
  Administered 2022-09-02: 6000 mL

## 2022-09-02 MED ORDER — CELECOXIB 200 MG PO CAPS
200.0000 mg | ORAL_CAPSULE | Freq: Once | ORAL | Status: AC
  Administered 2022-09-02: 200 mg via ORAL

## 2022-09-02 MED ORDER — GLYCOPYRROLATE 0.2 MG/ML IJ SOLN
INTRAMUSCULAR | Status: DC | PRN
  Administered 2022-09-02: .2 mg via INTRAVENOUS

## 2022-09-02 MED ORDER — DEXAMETHASONE SODIUM PHOSPHATE 4 MG/ML IJ SOLN
INTRAMUSCULAR | Status: DC | PRN
  Administered 2022-09-02: 5 mg via INTRAVENOUS

## 2022-09-02 MED ORDER — ACETAMINOPHEN 500 MG PO TABS
1000.0000 mg | ORAL_TABLET | Freq: Once | ORAL | Status: AC
  Administered 2022-09-02: 1000 mg via ORAL

## 2022-09-02 MED ORDER — DEXAMETHASONE SODIUM PHOSPHATE 10 MG/ML IJ SOLN
INTRAMUSCULAR | Status: AC
  Filled 2022-09-02: qty 1

## 2022-09-02 MED ORDER — LIDOCAINE HCL (PF) 2 % IJ SOLN
INTRAMUSCULAR | Status: AC
  Filled 2022-09-02: qty 5

## 2022-09-02 MED ORDER — LIDOCAINE HCL (CARDIAC) PF 100 MG/5ML IV SOSY
PREFILLED_SYRINGE | INTRAVENOUS | Status: DC | PRN
  Administered 2022-09-02: 60 mg via INTRAVENOUS

## 2022-09-02 MED ORDER — 0.9 % SODIUM CHLORIDE (POUR BTL) OPTIME
TOPICAL | Status: DC | PRN
  Administered 2022-09-02: 500 mL

## 2022-09-02 MED ORDER — ACETAMINOPHEN 500 MG PO TABS
ORAL_TABLET | ORAL | Status: AC
  Filled 2022-09-02: qty 2

## 2022-09-02 MED ORDER — IOHEXOL 300 MG/ML  SOLN
INTRAMUSCULAR | Status: DC | PRN
  Administered 2022-09-02: 10 mL via URETHRAL

## 2022-09-02 MED ORDER — FENTANYL CITRATE (PF) 100 MCG/2ML IJ SOLN: 25.0000 ug | INTRAMUSCULAR | Status: DC | PRN

## 2022-09-02 MED ORDER — ONDANSETRON HCL 4 MG/2ML IJ SOLN
INTRAMUSCULAR | Status: AC
  Filled 2022-09-02: qty 2

## 2022-09-02 MED ORDER — ONDANSETRON HCL 4 MG/2ML IJ SOLN
INTRAMUSCULAR | Status: DC | PRN
  Administered 2022-09-02: 4 mg via INTRAVENOUS

## 2022-09-02 SURGICAL SUPPLY — 22 items
BAG DRAIN URO-CYSTO SKYTR STRL (DRAIN) ×1 IMPLANT
BAG DRN UROCATH (DRAIN) ×1
BASKET LASER NITINOL 1.9FR (BASKET) IMPLANT
BSKT STON RTRVL 120 1.9FR (BASKET) ×1
CATH URETERAL DUAL LUMEN 10F (MISCELLANEOUS) IMPLANT
CATH URETL OPEN 5X70 (CATHETERS) ×1 IMPLANT
CLOTH BEACON ORANGE TIMEOUT ST (SAFETY) ×1 IMPLANT
EXTRACTOR STONE 1.7FRX115CM (UROLOGICAL SUPPLIES) IMPLANT
GLOVE BIO SURGEON STRL SZ7.5 (GLOVE) ×1 IMPLANT
GOWN STRL REUS W/TWL XL LVL3 (GOWN DISPOSABLE) ×1 IMPLANT
GUIDEWIRE STR DUAL SENSOR (WIRE) ×1 IMPLANT
IV NS IRRIG 3000ML ARTHROMATIC (IV SOLUTION) ×2 IMPLANT
KIT TURNOVER CYSTO (KITS) ×1 IMPLANT
LASER FIB FLEXIVA PULSE ID 365 (Laser) IMPLANT
MANIFOLD NEPTUNE II (INSTRUMENTS) ×1 IMPLANT
NS IRRIG 500ML POUR BTL (IV SOLUTION) ×1 IMPLANT
PACK CYSTO (CUSTOM PROCEDURE TRAY) ×1 IMPLANT
STENT URET 6FRX28 CONTOUR (STENTS) IMPLANT
TRACTIP FLEXIVA PULS ID 200XHI (Laser) IMPLANT
TRACTIP FLEXIVA PULSE ID 200 (Laser)
TUBE CONNECTING 12X1/4 (SUCTIONS) IMPLANT
TUBING UROLOGY SET (TUBING) ×1 IMPLANT

## 2022-09-02 NOTE — ED Provider Triage Note (Signed)
Emergency Medicine Provider Triage Evaluation Note  Kent Hartman , a 71 y.o. male  was evaluated in triage.  Pt complains of inability to urinate.  Patient had ureteroscopically performed earlier this morning for left-sided stone.  He states he has had minimal ability to urinate since post procedure.  Called urologist earlier told to come to emergency department for evaluation.  Patient is currently not having flank or suprapubic pain.  Episodes of urination earlier today have been bloody in appearance..  Review of Systems  Positive: See above Negative:   Physical Exam  BP 133/80   Pulse 89   Temp 97.7 F (36.5 C) (Oral)   Resp 18   SpO2 92%  Gen:   Awake, no distress   Resp:  Normal effort  MSK:   Moves extremities without difficulty  Other:  No suprapubic tenderness.  No CVA tenderness.  Medical Decision Making  Medically screening exam initiated at 5:52 PM.  Appropriate orders placed.  Kent Hartman was informed that the remainder of the evaluation will be completed by another provider, this initial triage assessment does not replace that evaluation, and the importance of remaining in the ED until their evaluation is complete.     Wilnette Kales, [place] 09/02/22 1754

## 2022-09-02 NOTE — Discharge Instructions (Addendum)
You were seen tonight for urinary retention.  Foley catheter was replaced which relieved your urinary retention.  Please call urology in the morning for a follow-up appointment.  If you become unable to urinate even with a catheter please return to the emergency department for evaluation

## 2022-09-02 NOTE — ED Provider Notes (Signed)
[facility] DEPT Provider Note   CSN: [id number] Arrival date & time: 09/02/22  1651     History  Chief Complaint  Patient presents with   Urinary Retention    Kent Hartman is a 71 y.o. male.  Patient presents the emergency department complaining of difficulty with urination.  Patient had cystoscopy earlier today with a left-sided ureteral stent placed.  This occurred approximate 10 AM this morning.  Patient states that since going home he has only peed a few teaspoons of urine which appeared to be grossly bloody.  Upon arrival to the emergency department he was able to be approximately 4 ounces twice including enough for a sample.  He states that by the time of the second sample he noticed much less blood in his urine.  Patient states he was unable to urinate anymore.  Bladder scan upon arrival showed over 250 mL of urine postvoid.  He denies abdominal pain, dysuria.  Endorses hematuria and difficulty with urination.  Past medical history otherwise significant for coronary artery disease, nephrolithiasis, thoracic ascending aortic aneurysm  HPI     Home Medications Prior to Admission medications   Medication Sig Start Date End Date Taking? Authorizing Provider  albuterol (VENTOLIN HFA) 108 (90 Base) MCG/ACT inhaler Inhale 1-2 puffs into the lungs every 6 (six) hours as needed for wheezing or shortness of breath.    [provider]  aspirin EC 81 MG tablet Take 81 mg by mouth daily.    [provider]  bisacodyl (DULCOLAX) 5 MG EC tablet Take 5 mg by mouth daily as needed.    [provider]  Cholecalciferol (VITAMIN D3) 25 MCG (1000 UT) CAPS Take 1,000 Int'l Units by mouth daily.    [provider]  diphenhydrAMINE (BENADRYL) 25 MG tablet Take 2 tablets (50 mg total) by mouth at bedtime as needed for sleep. 08/03/22   Vira Agar, MD  latanoprost (XALATAN) 0.005 % ophthalmic solution Place 1 drop into both eyes at  bedtime. 08/22/21   [provider]  losartan (COZAAR) 50 MG tablet Take 50 mg by mouth at bedtime.    [provider]  Magnesium 300 MG CAPS Take 1 capsule by mouth at bedtime.    [provider]  melatonin 5 MG TABS Take 5 mg by mouth at bedtime as needed.    [provider]  Multiple Vitamins-Minerals (CENTRUM SILVER PO) Take 1 tablet by mouth daily.    [provider]  oxyCODONE-acetaminophen (PERCOCET) 5-325 MG tablet Take 1 tablet by mouth every 4 (four) hours as needed for severe pain. 08/03/22   Vira Agar, MD  Pyridoxine HCl (VITAMIN B6) 100 MG TABS Take 100 mg by mouth daily. 07/28/21   [provider]  rosuvastatin (CRESTOR) 20 MG tablet Take 20 mg by mouth at bedtime. 09/09/21   [provider]  tamsulosin (FLOMAX) 0.4 MG CAPS capsule Take 1 capsule (0.4 mg total) by mouth daily. Patient taking differently: Take 0.4 mg by mouth at bedtime. 08/03/22   Vira Agar, MD  tamsulosin (FLOMAX) 0.4 MG CAPS capsule Take 1 capsule (0.4 mg total) by mouth daily. 09/02/22   Ardis Hughs, MD      Allergies    Patient has no known allergies.    Review of Systems   Review of Systems  Constitutional:  Negative for fever.  Respiratory:  Negative for shortness of breath.   Gastrointestinal:  Negative for abdominal pain.  Genitourinary:  Positive for difficulty urinating and hematuria.    Physical Exam Updated Vital Signs BP 120/70   Pulse 81   Temp 97.7 F (36.5 C) (Oral)   Resp 18   SpO2 94%  Physical Exam HENT:     Head: Normocephalic and atraumatic.  Eyes:     Pupils: Pupils are equal, round, and reactive to light.  Cardiovascular:     Rate and Rhythm: Normal rate.  Pulmonary:     Effort: Pulmonary effort is normal. No respiratory distress.  Genitourinary:    Penis: Normal.   Musculoskeletal:        General: No signs of injury.     Cervical back: Normal range of motion.  Skin:    General: Skin  is dry.     Capillary Refill: Capillary refill takes less than 2 seconds.  Neurological:     Mental Status: He is alert.  Psychiatric:        Speech: Speech normal.        Behavior: Behavior normal.     ED Results / Procedures / Treatments   Labs (all labs ordered are listed, but only abnormal results are displayed) Labs Reviewed  COMPREHENSIVE METABOLIC PANEL - Abnormal; Notable for the following components:      Result Value   Glucose, Bld 211 (*)    Creatinine, Ser 1.55 (*)    Calcium 8.7 (*)    Total Bilirubin 1.8 (*)    GFR, Estimated 48 (*)    All other components within normal limits  CBC WITH DIFFERENTIAL/PLATELET - Abnormal; Notable for the following components:   Platelets 144 (*)    Lymphs Abs 0.4 (*)    Monocytes Absolute 0.0 (*)    All other components within normal limits  URINALYSIS, ROUTINE W REFLEX MICROSCOPIC    EKG None  Radiology No results found.  Procedures Procedures    Medications Ordered in ED Medications - No data to display  ED Course/ Medical Decision Making/ A&P                           Medical Decision Making  Patient presents with a chief complaint of urinary retention  Bladder scan showed over 250 mL of urine.  Foley catheter was ordered.  Close to 300 mL of urine returned with Foley placement  I ordered and reviewed labs.  Urinalysis in process.  CMP with creatinine of 1.55, slightly elevated above baseline.  Grossly normal CBC  There is no indication at this time for admission.  Patient's retention has been relieved with Foley catheter placement.  Plan to discharge patient home with catheter and have patient follow-up with urology as an outpatient.  [facility] is aware that the patient was here tonight.  Feel this is likely due to a clot blocking the bladder outlet.  Patient given return precautions including no output through catheter.        Final Clinical Impression(s) / ED Diagnoses Final diagnoses:  Urinary  retention    Rx / DC Orders ED Discharge Orders     None         Ronny Bacon 09/02/22 2013    Audley Hose, MD 09/02/22 2056

## 2022-09-02 NOTE — Discharge Instructions (Addendum)
DISCHARGE INSTRUCTIONS FOR KIDNEY STONE/URETERAL STENT   MEDICATIONS:  1.  Resume all your other meds from home 2.  Take the percocet that you have at home as needed.  Okay to take tylenol and ibuprofen as well.  ACTIVITY:  1. No strenuous activity x 1week  2. No driving while on narcotic pain medications  3. Drink plenty of water  4. Continue to walk at home - you can still get blood clots when you are at home, so keep active, but don't over do it.  5. May return to work/school tomorrow or when you feel ready   BATHING:  1. You can shower and we recommend daily showers  2. You have a string coming from your urethra: The stent string is attached to your ureteral stent. Do not pull on this.   SIGNS/SYMPTOMS TO CALL:  Please call us if you have a fever greater than 101.5, uncontrolled nausea/vomiting, uncontrolled pain, dizziness, unable to urinate, bloody urine, chest pain, shortness of breath, leg swelling, leg pain, redness around wound, drainage from wound, or any other concerns or questions.   You can reach [place] at [phone].   FOLLOW-UP:  1.  You will be scheduled for stent removal in 2 weeks.   Post Anesthesia Home Care Instructions  Activity: Get plenty of rest for the remainder of the day. A responsible individual must stay with you for 24 hours following the procedure.  For the next 24 hours, DO NOT: -Drive a car -Paediatric nurse -Drink alcoholic beverages -Take any medication unless instructed by your physician -Make any legal decisions or sign important papers.  Meals: Start with liquid foods such as gelatin or soup. Progress to regular foods as tolerated. Avoid greasy, spicy, heavy foods. If nausea and/or vomiting occur, drink only clear liquids until the nausea and/or vomiting subsides. Call your physician if vomiting continues.  Special Instructions/Symptoms: Your throat may feel dry or sore from the anesthesia or the breathing tube placed in your throat  during surgery. If this causes discomfort, gargle with warm salt water. The discomfort should disappear within 24 hours.    CYSTOSCOPY HOME CARE INSTRUCTIONS  Activity: Rest for the remainder of the day.  Do not drive or operate equipment today.  You may resume normal activities in one to two days as instructed by your physician.   Meals: Drink plenty of liquids and eat light foods such as gelatin or soup this evening.  You may return to a normal meal plan tomorrow.  Return to Work: You may return to work in one to two days or as instructed by your physician.  Special Instructions / Symptoms: Call your physician if any of these symptoms occur:   -persistent or heavy bleeding  -bleeding which continues after first few urination  -large blood clots that are difficult to pass  -urine stream diminishes or stops completely  -fever equal to or higher than 101 degrees Farenheit.  -cloudy urine with a strong, foul odor  -severe pain No Tylenol until 12:00

## 2022-09-02 NOTE — Progress Notes (Signed)
RN ambulated patient to the bathroom.  He was unable to urinate at that time.  He was moved to phase II.

## 2022-09-02 NOTE — Anesthesia Procedure Notes (Signed)
Procedure Name: LMA Insertion Date/Time: 09/02/2022 7:56 AM  Performed by: Justice Rocher, CRNAPre-anesthesia Checklist: Patient identified, Emergency Drugs available, Suction available, Patient being monitored and Timeout performed Patient Re-evaluated:Patient Re-evaluated prior to induction Oxygen Delivery Method: Circle system utilized Preoxygenation: Pre-oxygenation with 100% oxygen Induction Type: IV induction Ventilation: Mask ventilation without difficulty LMA: LMA inserted LMA Size: 5.0 Number of attempts: 1 Airway Equipment and Method: Bite block Placement Confirmation: positive ETCO2, breath sounds checked- equal and bilateral and CO2 detector Tube secured with: Tape Dental Injury: Teeth and Oropharynx as per pre-operative assessment

## 2022-09-02 NOTE — Op Note (Signed)
Preoperative diagnosis: left ureteral calculus  Postoperative diagnosis: bilateral, left ureteral calculus  Procedure:  Cystoscopy left ureteroscopy and stone removal Ureteroscopic laser lithotripsy left 43F x 28 ureteral stent placement  left retrograde pyelography with interpretation  Surgeon: Ardis Hughs, MD  Anesthesia: General  Complications: None  Intraoperative findings: #1 -  left retrograde pyelography demonstrated a filling defect within the left ureter consistent with the patient's known calculus without other abnormalities. #2 -Patient's stone was impacted in the proximal ureter.  This required bleeding along the wall with some mild mucosal trauma.  In addition, patient was treated there was some trauma incurred at the pelvic inlet of the ureter due to sharp angle of the ureter. 3.:  [name] prostate was somewhat obstructing and obscuring the left ureteral orifice and as a result accessing the left ureteral orifice may create some prostatic bleeding.  EBL: Minimal  Specimens: left ureteral calculus   Disposition of specimens: [facility] for stone analysis  Indication: Kent Hartman is a 71 y.o.   patient with a left ureteral stone and associated left symptoms.  Initially, the patient proceeded with shockwave lithotripsy, but unfortunately the stone did not fragment very well and subsequently opted to proceed with ureteroscopy. After reviewing the management options for treatment, the patient elected to proceed with the above surgical procedure(s). We have discussed the potential benefits and risks of the procedure, side effects of the proposed treatment, the likelihood of the patient achieving the goals of the procedure, and any potential problems that might occur during the procedure or recuperation. Informed consent has been obtained.   Description of procedure:  The patient was taken to the operating room and general anesthesia was  induced.  The patient was placed in the dorsal lithotomy position, prepped and draped in the usual sterile fashion, and preoperative antibiotics were administered. A preoperative time-out was performed.   Cystourethroscopy was performed.  [name] urethra was examined and was normal, demonstrated bilobar prostatic hypertrophy.  Patient's the bladder was then systematically examined in its entirety. There was no evidence for any bladder tumors, stones, or other mucosal pathology.    Attention then turned to the left ureteral orifice and a ureteral catheter was used to intubate the ureteral orifice.  Omnipaque contrast was injected through the ureteral catheter and a retrograde pyelogram was performed with findings as dictated above.  A 0.38 sensor guidewire was then advanced up the left ureter into the renal pelvis under fluoroscopic guidance. The 6 Fr semirigid ureteroscope was then advanced into the ureter next to the guidewire and the calculus was identified.   The stone was then fragmented with the 365 micron holmium laser fiber on a setting of 1.0 and frequency of 10 Hz.   All stones were then removed from the ureter with an N-gage nitinol basket.  Reinspection of the ureter revealed no remaining visible stones or fragments.   The wire was then backloaded through the cystoscope and a ureteral stent was advance over the wire using Seldinger technique.  The stent was positioned appropriately under fluoroscopic and cystoscopic guidance.  The wire was then removed with an adequate stent curl noted in the renal pelvis as well as in the bladder.  The bladder was then emptied and the procedure ended.  The patient appeared to tolerate the procedure well and without complications.  The patient was able to be awakened and transferred to the recovery unit in satisfactory condition.   As a result of the ureteral trauma  I did not tether and we will schedule the patient to return to clinic in 2 weeks for  stent removal.  Disposition: The patient will be scheduled for stent removal in 14 days in our clinic.

## 2022-09-02 NOTE — Transfer of Care (Signed)
Immediate Anesthesia Transfer of Care Note  Patient: Kent Hartman  Procedure(s) Performed: Procedure(s) (LRB): LEFT URETEROSCOPY/HOLMIUM LASER/LEFT RETROGRADE PYELOGRAM/STENT PLACEMENT (Left)  Patient Location: PACU  Anesthesia Type: General  Level of Consciousness: awake, sedated, patient cooperative and responds to stimulation  Airway & Oxygen Therapy: Patient Spontanous Breathing and Patient connected to [place] oxygen  Post-op Assessment: Report given to PACU RN, Post -op Vital signs reviewed and stable and Patient moving all extremities  Post vital signs: Reviewed and stable  Complications: No apparent anesthesia complications

## 2022-09-02 NOTE — Interval H&P Note (Signed)
History and Physical Interval Note:  09/02/2022 7:43 AM  Kent Hartman  has presented today for surgery, with the diagnosis of LEFT URETERAL STONE.  The various methods of treatment have been discussed with the patient and family. After consideration of risks, benefits and other options for treatment, the patient has consented to  Procedure(s) with comments: LEFT URETEROSCOPY/HOLMIUM LASER/LEFT RETROGRADE PYELOGRAM/STENT PLACEMENT (Left) - 60 MINUTES NEEDED FOR CASE as a surgical intervention.  [name] history has been reviewed, patient examined, no change in status, stable for surgery.  I have reviewed the patient's chart and labs.  Questions were answered to the patient's satisfaction.     Ardis Hughs

## 2022-09-02 NOTE — ED Triage Notes (Signed)
Patient arrives ambulatory by POV with wife stating sent by [facility] due to unable to void since procedure this morning. Patient had left ureteroscopy today to remove kidney stone. States only able to pass about 2 tablespoons of bloody urine.

## 2022-09-02 NOTE — ED Notes (Signed)
Attempted 3x to take temperature, was unable to obtain.

## 2022-09-02 NOTE — H&P (Signed)
71 year old male who presents today for evaluation of a nonobstructing left-sided kidney stone. The patient was seen in the emergency department about 6 weeks ago with acute onset left-sided renal colic-like symptoms. He underwent further evaluation with a CAT scan that demonstrated a 11 mm stone in the left UPJ. There was also a nonobstructing stone in the right kidney that was approximately 3 mm. The patient left the emergency department with no significant pain. He does not recall passing a stone and did not catch 1. Since the emergency room visit he has not had any ongoing pain.   The patient has a history of nephrolithiasis and was seen here 7 years ago for a right distal ureteral stone. That did not require any intervention.   Interval: The patient is here for annual follow-up. At his last office visit he had a left UPJ stone that was asymptomatic. He opted to increase his fluid intake and try some conservative measures for stone prevention. Over the past year he denies any associated flank pain or gross hematuria. He has not passed a stone that he knows of. He has not had a KUB today.   08/19/2022: Mr. Schulke is a 71 year old man who underwent a left-sided lithotripsy for a 12 x 7 mm left-sided ureteral stone. He presents today for follow-up appointment. He is only passed a scant amount of stone material. He continues to have some interval discomfort. He denies gross hematuria, fevers, chills. He denies any voiding changes.     ALLERGIES:     MEDICATIONS: Benadryl  Centrum Sliver  Docusate Sodium  Dulcolax  Latanoprost 0.005 % drops  Losartan Potassium 50 mg tablet  Magnesium  Prevastatin  Vitamin D3     GU PSH: ESWL - 08/03/2022       PSH Notes: No Surgical Problems   NON-GU PSH: No Non-GU PSH    GU PMH: Renal and ureteral calculus - 07/28/2022 Renal calculus - 07/28/2021, Calculus of kidney, - 2015 BPH w/o LUTS, BPH (benign prostatic hyperplasia) - 2015 Ureteral calculus,  Ureteral stone - 2015    NON-GU PMH: Encounter for general adult medical examination without abnormal findings, Encounter for preventive health examination - 2015 Personal history of other endocrine, nutritional and metabolic disease, History of hypercholesterolemia - 2015 Glaucoma Hypercholesterolemia Hypertension Sleep Apnea    FAMILY HISTORY: 2 daughters - Runs in Family Alzheimer's Disease - Mother, Runs in Family Deceased - Father, Runs in Family   SOCIAL HISTORY: Marital Status: Married Drinks 3 caffeinated drinks per day.     Notes: Never a smoker, No alcohol use, Occupation, Caffeine use   REVIEW OF SYSTEMS:    GU Review Male:   Patient denies frequent urination, hard to postpone urination, burning/ pain with urination, get up at night to urinate, leakage of urine, stream starts and stops, trouble starting your stream, have to strain to urinate , erection problems, and penile pain.  Gastrointestinal (Upper):   Patient denies nausea, vomiting, and indigestion/ heartburn.  Gastrointestinal (Lower):   Patient denies diarrhea and constipation.  Constitutional:   Patient denies fever, night sweats, weight loss, and fatigue.  Skin:   Patient denies skin rash/ lesion and itching.  Musculoskeletal:   Patient denies back pain and joint pain.  Neurological:   Patient denies headaches and dizziness.  Psychologic:   Patient denies depression and anxiety.   VITAL SIGNS:      08/19/2022 11:27 AM  BP 114/76 mmHg  Pulse 74 /min  Temperature 97.7 F / 36.5 C  GU PHYSICAL EXAMINATION:      Notes: No CVA tenderness   MULTI-SYSTEM PHYSICAL EXAMINATION:    Constitutional: Well-nourished. No physical deformities. Normally developed. Good grooming.  Respiratory: No labored breathing, no use of accessory muscles.   Cardiovascular: Normal temperature, normal extremity pulses, no swelling, no varicosities.  Skin: No paleness, no jaundice, no cyanosis. No lesion, no ulcer, no rash.   Neurologic / Psychiatric: Oriented to time, oriented to place, oriented to person. No depression, no anxiety, no agitation.  Gastrointestinal: Abdomen is soft and nontender to palpation.     Complexity of Data:  Source Of History:  Patient  Records Review:   Previous Doctor Records, Previous Patient Records  Urine Test Review:   Urinalysis  X-Ray Review: KUB: Reviewed Films. Reviewed Report. Discussed With Patient.     11/29/13  PSA  Total PSA 1.33     PROCEDURES:         KUB - 74018  A single view of the abdomen is obtained. There is a prominent overlying bowel gas pattern. Stable right-sided pelvic phleboliths within the pelvic inlet are noted. Tracing along the anatomical course of the left ureter there remains a 7 x 9 mm calcification at the level of L4 behind the spinous process that is prominent and correlates with pre-existing stone.      . Patient confirmed No Neulasta OnPro Device.           Urinalysis Dipstick Dipstick Cont'd  Color: Yellow Bilirubin: Neg mg/dL  Appearance: Clear Ketones: Neg mg/dL  Specific Gravity: 1.025 Blood: Neg ery/uL  pH: 6.0 Protein: Neg mg/dL  Glucose: Neg mg/dL Urobilinogen: 0.2 mg/dL    Nitrites: Neg    Leukocyte Esterase: Neg leu/uL    ASSESSMENT:      ICD-10 Details  1 GU:   Renal and ureteral calculus - N20.2 Left, Chronic, Stable   PLAN:           Schedule Return Visit/Planned Activity: Next Available Appointment - Schedule Surgery          Document Letter(s):  Created for Patient: Clinical Summary         Notes:   Urinalysis is clear. The patient has failed ESWL and a 7 x 9 mm stone remains at the level of L4 on the left. I will send a small amount of fragments he brought with him for analysis today. I discussed definitive stone intervention for remaining stone in detail with him including the role of repeat lithotripsy as well as the option of ureteroscopy.   Stone intervention was discussed in detail today. For  ureteroscopy, the patient understands that there is a chance for a staged procedure. Patient also understands that there is risk for bleeding, infection, injury to surrounding organs, and general risks of anesthesia. The patient also understands the placement of a stent and the risks of stent placement including, risk for infection, the risk for pain, and the risk for injury. For ESWL, the patient understands that there is a chance of failure of procedure, there is also a risk for bruising, infection, bleeding, and injury to surrounding structures. The patient verbalized understanding to these risks.   He would like to undergo ureteroscopy. A green sheet will be placed today. Strict return to clinic precautions advised in the interval.

## 2022-09-03 ENCOUNTER — Encounter (HOSPITAL_BASED_OUTPATIENT_CLINIC_OR_DEPARTMENT_OTHER): Payer: Self-pay | Admitting: Urology

## 2022-09-03 DIAGNOSIS — N200 Calculus of kidney: Secondary | ICD-10-CM | POA: Diagnosis not present

## 2022-09-03 NOTE — Anesthesia Postprocedure Evaluation (Signed)
Anesthesia Post Note  Patient: Kent Hartman  Procedure(s) Performed: LEFT URETEROSCOPY/HOLMIUM LASER/LEFT RETROGRADE PYELOGRAM/STENT PLACEMENT (Left: Renal)     Patient location during evaluation: PACU Anesthesia Type: General Level of consciousness: awake and alert Pain management: pain level controlled Vital Signs Assessment: post-procedure vital signs reviewed and stable Respiratory status: spontaneous breathing, nonlabored ventilation, respiratory function stable and patient connected to nasal cannula oxygen Cardiovascular status: blood pressure returned to baseline and stable Postop Assessment: no apparent nausea or vomiting Anesthetic complications: no   No notable events documented.  Last Vitals:  Vitals:   09/02/22 1017 09/02/22 1020  BP: 130/70 135/84  Pulse: 85 81  Resp: 15 16  Temp:    SpO2: 91% 95%    Last Pain:  Vitals:   09/03/22 1101  TempSrc:   PainSc: 2                  Belenda Cruise P Jaclyn Andy

## 2022-09-08 ENCOUNTER — Telehealth: Payer: Self-pay | Admitting: *Deleted

## 2022-09-10 LAB — CALCULI, WITH PHOTOGRAPH (CLINICAL LAB)
Calcium Oxalate Monohydrate: 100 %
Weight Calculi: 16 mg

## 2022-09-13 NOTE — Progress Notes (Unsigned)
Kent Hartman, male    DOB: Dec 17, 1950   MRN: [id number]   Brief patient profile:  70yowm  never smoker with h/o seasonal rhinitis in his 89s otc  referred to pulmonary clinic 09/14/2022 by Kent Hartman for breathing problems assoc with choking sensations  lasting several minutes  since the age of 38     Last known exposure was 27 with known bilateral pleural plaques   History of Present Illness  09/14/2022  Pulmonary/ 1st office eval/Kent Hartman  Chief Complaint  Patient presents with   Consult    Asthma  Dyspnea:  fast walk sometimes up to 3-4 miles some jogging on a flat surface mostly Cough: dry/ paroxysmal year round up to several times a week resolves s rx p few min at most  Sleep: no resp cc in recliner 30 degrees SABA use: no benefit   No obvious day to day or daytime pattern/variability or assoc excess/ purulent sputum or mucus plugs or hemoptysis or cp or chest tightness, subjective wheeze or overt sinus or hb symptoms.   Sleeping  without nocturnal  or early am exacerbation  of respiratory  c/o's or need for noct saba. Also denies any obvious fluctuation of symptoms with weather or environmental changes or other aggravating or alleviating factors except as outlined above   No unusual exposure hx or h/o childhood pna/ asthma or knowledge of premature birth.  Current Allergies, Complete Past Medical History, Past Surgical History, Family History, and Social History were reviewed in [employer] record.  ROS  The following are not active complaints unless bolded Hoarseness/loss of voice only during spells, sore throat, dysphagia, dental problems, itching, sneezing,  nasal congestion or discharge of excess mucus or purulent secretions, ear ache,   fever, chills, sweats, unintended wt loss or wt gain, classically pleuritic or exertional cp,  orthopnea pnd or arm/hand swelling  or leg swelling, presyncope, palpitations, abdominal pain, anorexia, nausea, vomiting,  diarrhea  or change in bowel habits or change in bladder habits, change in stools or change in urine, dysuria, hematuria,  rash, arthralgias, visual complaints, headache, numbness, weakness or ataxia or problems with walking or coordination,  change in mood or  memory.           Past Medical History:  Diagnosis Date   Coronary artery disease involving native coronary artery of native heart without angina pectoris    cardiologist--- Kent Hartman;   nuclear stress test 10-15-2021  low risk normal perfusion, nuclear ef 61%;   CCTA score =720   Disorder of lipoprotein metabolism    Glaucoma, left eye    Hard of hearing    History of cellulitis 06/21/2015   w/ abscess LLE   History of methicillin resistant staphylococcus aureus (MRSA)    HLD (hyperlipidemia)    Hyperlipidemia    Insomnia    Left ureteral calculus    Nephrolithiasis    11/ 2023  small non-obs right side   Pleural plaque due to asbestos exposure    pulmonologist--- Kent Hartman 09-08-2019)   (bilateral , stable per at CT 08-19-2021  )   Pre-diabetes    Thoracic ascending aortic aneurysm [facility])    followed by cardiologist---  last echo 35m    Outpatient Medications Prior to Visit  Medication Sig Dispense Refill   albuterol (VENTOLIN HFA) 108 (90 Base) MCG/ACT inhaler Inhale 1-2 puffs into the lungs every 6 (six) hours as needed for wheezing or shortness of breath.     aspirin EC  81 MG tablet Take 81 mg by mouth daily.     latanoprost (XALATAN) 0.005 % ophthalmic solution Place 1 drop into both eyes at bedtime.     losartan (COZAAR) 50 MG tablet Take 50 mg by mouth at bedtime.     oxyCODONE-acetaminophen (PERCOCET) 5-325 MG tablet Take 1 tablet by mouth every 4 (four) hours as needed for severe pain. 12 tablet 0   rosuvastatin (CRESTOR) 20 MG tablet Take 20 mg by mouth at bedtime.     tamsulosin (FLOMAX) 0.4 MG CAPS capsule Take 1 capsule (0.4 mg total) by mouth daily. 21 capsule 0   bisacodyl (DULCOLAX) 5 MG EC tablet Take  5 mg by mouth daily as needed. (Patient not taking: Reported on 09/14/2022)     Cholecalciferol (VITAMIN D3) 25 MCG (1000 UT) CAPS Take 1,000 Int'l Units by mouth daily. (Patient not taking: Reported on 09/14/2022)     diphenhydrAMINE (BENADRYL) 25 MG tablet Take 2 tablets (50 mg total) by mouth at bedtime as needed for sleep. (Patient not taking: Reported on 09/14/2022) 30 tablet 0   Magnesium 300 MG CAPS Take 1 capsule by mouth at bedtime.     melatonin 5 MG TABS Take 5 mg by mouth at bedtime as needed.     Multiple Vitamins-Minerals (CENTRUM SILVER PO) Take 1 tablet by mouth daily. (Patient not taking: Reported on 09/14/2022)     Pyridoxine HCl (VITAMIN B6) 100 MG TABS Take 100 mg by mouth daily.     tamsulosin (FLOMAX) 0.4 MG CAPS capsule Take 1 capsule (0.4 mg total) by mouth daily. (Patient taking differently: Take 0.4 mg by mouth at bedtime.) 30 capsule 0   Facility-Administered Medications Prior to Visit  Medication Dose Route Frequency Provider Last Rate Last Admin   regadenoson (LEXISCAN) injection SOLN 0.4 mg  0.4 mg Intravenous Once Kent Hartman, Kent Freer, MD         Objective:     BP 116/70 (BP Location: Left Arm, Patient Position: Sitting, Cuff Size: Normal)   Pulse 75   Temp 98 F (36.7 C) (Oral)   Ht 6' (1.829 m)   Wt 248 lb (112.5 kg)   SpO2 95% Comment: ra  BMI 33.63 kg/m   SpO2: 95 % (ra)  Amb pleasant wm nad    HEENT : Oropharynx  clear      Nasal turbinates nl    NECK :  without  apparent JVD/ palpable Nodes/TM    LUNGS: no acc muscle use,  Nl contour chest which is clear to A and P bilaterally without cough on insp or exp maneuvers   CV:  RRR  no s3 or murmur or increase in P2, and no edema   ABD:  soft and nontender with nl inspiratory excursion in the supine position. No bruits or organomegaly appreciated   MS:  Nl gait/ ext warm without deformities Or obvious joint restrictions  calf tenderness, cyanosis or clubbing    SKIN: warm and dry without  lesions    NEURO:  alert, approp, nl sensorium with  no motor or cerebellar deficits apparent.     I personally reviewed images and agree with radiology impression as follows:   Chest CT with contrast 09/14/2022      1. 4.0 cm dilatation of the ascending thoracic aorta, at the sinuses of Valsalva. Continue annual imaging followup by CTA or MRA. This recommendation follows 2010 ACCF/AHA/AATS/ACR/ASA/SCA/SCAI/SIR/STS/SVM Guidelines for the Diagnosis and Management of Patients with Thoracic Aortic Disease. Circulation. 2010; 121: Y706-C376. Aortic  aneurysm NOS (ICD10-I71.9) 2. 6 mm LEFT part-solid pulmonary nodule within the upper lobe. Per Fleischner Society Guidelines, recommend a non-contrast Chest CT at 3-6 months to confirm persistence.   3. Sub 1 cm incidental RIGHT thyroid nodule. No follow-up imaging is recommended.       Assessment   Upper airway cough syndrome Onset age 54 with a background of seasonal rhinitis late summer early fall  - Allergy screen 09/14/2022 >  Eos 0. /  IgE   - GERD rx 09/14/2022 >>>  Upper airway cough syndrome (previously labeled PNDS),  is so named because it's frequently impossible to sort out how much is  CR/sinusitis with freq throat clearing (which can be related to primary GERD)   vs  causing  secondary (" extra esophageal")  GERD from wide swings in gastric pressure that occur with throat clearing, often  promoting self use of mint and menthol lozenges that reduce the lower esophageal sphincter tone and exacerbate the problem further in a cyclical fashion.   These are the same pts (now being labeled as having "irritable larynx syndrome" by some cough centers) who not infrequently have a history of having failed to tolerate ace inhibitors,  dry powder inhalers or biphosphonates or report having atypical/extraesophageal reflux symptoms that don't respond to standard doses of PPI  and are easily confused as having aecopd or asthma flares by even  experienced allergists/ pulmonologists (myself included).   Rec max gerd rx x 6 weeks then regroup       Solitary pulmonary nodule on lung CT Never smoker 6 mm LEFT part-solid pulmonary nodule within the upper lobe. Per Fleischner Society Guidelines, recommend a non-contrast Chest CT at 3-6 months to confirm persistence  CT results reviewed with pt >>> Too small for PET or bx, not suspicious enough for excisional bx > really only option for now is follow the Fleischner society guidelines as rec by radiology.  Discussed in detail all the  indications, usual  risks and alternatives  relative to the benefits with patient who agrees to proceed wit  f/u as outlined     Placed in reminder for recall  in 6 m          Each maintenance medication was reviewed in detail including emphasizing most importantly the difference between maintenance and prns and under what circumstances the prns are to be triggered using an action plan format where appropriate.  Total time for H and P, chart review, counseling,   and generating customized AVS unique to this office visit / same day charting = 46 min new pt eval            Christinia Gully, MD 09/14/2022

## 2022-09-14 ENCOUNTER — Ambulatory Visit
Admission: RE | Admit: 2022-09-14 | Discharge: 2022-09-14 | Disposition: A | Discharge status: Home / Self Care | Payer: PPO | Source: Ambulatory Visit | Attending: Internal Medicine | Admitting: Internal Medicine

## 2022-09-14 ENCOUNTER — Ambulatory Visit: Payer: PPO | Admitting: Internal Medicine

## 2022-09-14 ENCOUNTER — Encounter: Payer: Self-pay | Admitting: Internal Medicine

## 2022-09-14 VITALS — BP 116/70 | HR 75 | Temp 98.0°F | Ht 72.0 in | Wt 248.0 lb

## 2022-09-14 DIAGNOSIS — R911 Solitary pulmonary nodule: Secondary | ICD-10-CM | POA: Diagnosis not present

## 2022-09-14 DIAGNOSIS — R058 Other specified cough: Secondary | ICD-10-CM | POA: Insufficient documentation

## 2022-09-14 DIAGNOSIS — I7121 Aneurysm of the ascending aorta, without rupture: Secondary | ICD-10-CM

## 2022-09-14 DIAGNOSIS — I2510 Atherosclerotic heart disease of native coronary artery without angina pectoris: Secondary | ICD-10-CM | POA: Diagnosis not present

## 2022-09-14 DIAGNOSIS — Q8909 Congenital malformations of spleen: Secondary | ICD-10-CM | POA: Diagnosis not present

## 2022-09-14 DIAGNOSIS — N281 Cyst of kidney, acquired: Secondary | ICD-10-CM | POA: Diagnosis not present

## 2022-09-14 DIAGNOSIS — I712 Thoracic aortic aneurysm, without rupture, unspecified: Secondary | ICD-10-CM | POA: Diagnosis not present

## 2022-09-14 LAB — CBC WITH DIFFERENTIAL/PLATELET
Basophils Absolute: 0.1 K/uL (ref 0.0–0.1)
Basophils Relative: 1.1 % (ref 0.0–3.0)
Eosinophils Absolute: 0.2 K/uL (ref 0.0–0.7)
Eosinophils Relative: 2.5 % (ref 0.0–5.0)
HCT: 40.5 % (ref 39.0–52.0)
Hemoglobin: 13.8 g/dL (ref 13.0–17.0)
Lymphocytes Relative: 25.1 % (ref 12.0–46.0)
Lymphs Abs: 1.7 K/uL (ref 0.7–4.0)
MCHC: 34.1 g/dL (ref 30.0–36.0)
MCV: 92.1 fl (ref 78.0–100.0)
Monocytes Absolute: 0.6 K/uL (ref 0.1–1.0)
Monocytes Relative: 8.4 % (ref 3.0–12.0)
Neutro Abs: 4.2 K/uL (ref 1.4–7.7)
Neutrophils Relative %: 62.9 % (ref 43.0–77.0)
Platelets: 181 K/uL (ref 150.0–400.0)
RBC: 4.4 Mil/uL (ref 4.22–5.81)
RDW: 13.8 % (ref 11.5–15.5)
WBC: 6.7 K/uL (ref 4.0–10.5)

## 2022-09-14 MED ORDER — FAMOTIDINE 20 MG PO TABS: ORAL_TABLET | ORAL | 11 refills | Status: DC

## 2022-09-14 MED ORDER — IOPAMIDOL (ISOVUE-300) INJECTION 61%
75.0000 mL | Freq: Once | INTRAVENOUS | Status: AC | PRN
  Administered 2022-09-14: 75 mL via INTRAVENOUS

## 2022-09-14 MED ORDER — PANTOPRAZOLE SODIUM 40 MG PO TBEC: 40.0000 mg | DELAYED_RELEASE_TABLET | Freq: Every day | ORAL | 2 refills | Status: DC

## 2022-09-14 NOTE — Patient Instructions (Addendum)
Pantoprazole (protonix) 40 mg   Take  30-60 min before first meal of the day and Pepcid (famotidine)  20 mg after supper until return to office - this is the best way to tell whether stomach acid is contributing to your problem.    GERD (REFLUX)  is an extremely common cause of respiratory symptoms just like yours , many times with no obvious heartburn at all.    It can be treated with medication, but also with lifestyle changes including elevation of the head of your bed (ideally with 6 -8inch blocks under the headboard of your bed),  Smoking cessation, avoidance of late meals, excessive alcohol, and avoid fatty foods, chocolate, peppermint, colas, red wine, and acidic juices such as orange juice.  NO MINT OR MENTHOL PRODUCTS SO NO COUGH DROPS  USE SUGARLESS CANDY INSTEAD (Jolley ranchers or Stover's or Life Savers) or even ice chips will also do - the key is to swallow to prevent all throat clearing. NO OIL BASED VITAMINS - use powdered substitutes.  Avoid fish oil when coughing.   I will place you in reminder file for follow up in 6 months to make the CT of chest   Please schedule a follow up office visit in 6 weeks, call sooner if needed

## 2022-09-14 NOTE — Assessment & Plan Note (Signed)
Onset age 71 with a background of seasonal rhinitis late summer early fall  - Allergy screen 09/14/2022 >  Eos 0. /  IgE   - GERD rx 09/14/2022 >>>  Upper airway cough syndrome (previously labeled PNDS),  is so named because it's frequently impossible to sort out how much is  CR/sinusitis with freq throat clearing (which can be related to primary GERD)   vs  causing  secondary (" extra esophageal")  GERD from wide swings in gastric pressure that occur with throat clearing, often  promoting self use of mint and menthol lozenges that reduce the lower esophageal sphincter tone and exacerbate the problem further in a cyclical fashion.   These are the same pts (now being labeled as having "irritable larynx syndrome" by some cough centers) who not infrequently have a history of having failed to tolerate ace inhibitors,  dry powder inhalers or biphosphonates or report having atypical/extraesophageal reflux symptoms that don't respond to standard doses of PPI  and are easily confused as having aecopd or asthma flares by even experienced allergists/ pulmonologists (myself included).   Rec max gerd rx x 6 weeks then regroup

## 2022-09-15 ENCOUNTER — Encounter: Payer: Self-pay | Admitting: Internal Medicine

## 2022-09-15 DIAGNOSIS — R911 Solitary pulmonary nodule: Secondary | ICD-10-CM | POA: Insufficient documentation

## 2022-09-15 LAB — IGE: IgE (Immunoglobulin E), Serum: 29 kU/L (ref <=114)

## 2022-09-15 NOTE — Assessment & Plan Note (Signed)
Never smoker 6 mm LEFT part-solid pulmonary nodule within the upper lobe. Per Fleischner Society Guidelines, recommend a non-contrast Chest CT at 3-6 months to confirm persistence  CT results reviewed with pt >>> Too small for PET or bx, not suspicious enough for excisional bx > really only option for now is follow the Fleischner society guidelines as rec by radiology.  Discussed in detail all the  indications, usual  risks and alternatives  relative to the benefits with patient who agrees to proceed wit  f/u as outlined     Placed in reminder for recall  in 6 m          Each maintenance medication was reviewed in detail including emphasizing most importantly the difference between maintenance and prns and under what circumstances the prns are to be triggered using an action plan format where appropriate.  Total time for H and P, chart review, counseling,   and generating customized AVS unique to this office visit / same day charting = 46 min new pt eval

## 2022-09-16 ENCOUNTER — Telehealth: Payer: Self-pay

## 2022-09-16 DIAGNOSIS — N202 Calculus of kidney with calculus of ureter: Secondary | ICD-10-CM | POA: Diagnosis not present

## 2022-09-16 NOTE — Telephone Encounter (Signed)
Called Pt with results from labs drawn. Pt stated understanding and nothing further needed.

## 2022-09-16 NOTE — Telephone Encounter (Signed)
-----   Message from Tanda Rockers, MD sent at 09/15/2022  5:29 PM EST ----- Call patient :  Studies are neg for allergy

## 2022-09-25 DIAGNOSIS — Z131 Encounter for screening for diabetes mellitus: Secondary | ICD-10-CM | POA: Diagnosis not present

## 2022-10-14 DIAGNOSIS — N200 Calculus of kidney: Secondary | ICD-10-CM | POA: Diagnosis not present

## 2022-11-11 DIAGNOSIS — I10 Essential (primary) hypertension: Secondary | ICD-10-CM | POA: Diagnosis not present

## 2022-11-11 DIAGNOSIS — R7303 Prediabetes: Secondary | ICD-10-CM | POA: Diagnosis not present

## 2022-12-01 DIAGNOSIS — H401122 Primary open-angle glaucoma, left eye, moderate stage: Secondary | ICD-10-CM | POA: Diagnosis not present

## 2022-12-01 DIAGNOSIS — H25812 Combined forms of age-related cataract, left eye: Secondary | ICD-10-CM | POA: Diagnosis not present

## 2022-12-01 DIAGNOSIS — H2511 Age-related nuclear cataract, right eye: Secondary | ICD-10-CM | POA: Diagnosis not present

## 2022-12-01 DIAGNOSIS — H43812 Vitreous degeneration, left eye: Secondary | ICD-10-CM | POA: Diagnosis not present

## 2022-12-29 DIAGNOSIS — J209 Acute bronchitis, unspecified: Secondary | ICD-10-CM | POA: Diagnosis not present

## 2022-12-29 DIAGNOSIS — R0981 Nasal congestion: Secondary | ICD-10-CM | POA: Diagnosis not present

## 2022-12-29 DIAGNOSIS — R051 Acute cough: Secondary | ICD-10-CM | POA: Diagnosis not present

## 2022-12-29 DIAGNOSIS — J019 Acute sinusitis, unspecified: Secondary | ICD-10-CM | POA: Diagnosis not present

## 2023-02-17 ENCOUNTER — Telehealth: Payer: Self-pay | Admitting: Cardiology

## 2023-02-17 NOTE — Telephone Encounter (Signed)
No new orders at this time.  Dr Anne Fu will determine if further testing is needed once pt has been seen in office.

## 2023-02-17 NOTE — Telephone Encounter (Signed)
Wife wanted to know if patient needs to repeat his echocardiogram or do he need any other test pr procedures before his appointment in October. Please advise

## 2023-02-17 NOTE — Telephone Encounter (Signed)
Darl Pikes is aware further testing to be determined at the time of the f/u appt with Dr Anne Fu.   She was appreciative of the call back and information

## 2023-02-17 NOTE — Telephone Encounter (Signed)
Wife wants call back to discuss if patient will need tests ordered prior to his next OV on 10/7.

## 2023-02-27 ENCOUNTER — Other Ambulatory Visit: Payer: Self-pay | Admitting: Internal Medicine

## 2023-02-27 DIAGNOSIS — R058 Other specified cough: Secondary | ICD-10-CM

## 2023-03-05 ENCOUNTER — Other Ambulatory Visit: Payer: Self-pay

## 2023-03-05 DIAGNOSIS — R058 Other specified cough: Secondary | ICD-10-CM

## 2023-03-05 MED ORDER — PANTOPRAZOLE SODIUM 40 MG PO TBEC: DELAYED_RELEASE_TABLET | ORAL | 0 refills | Status: DC

## 2023-03-29 ENCOUNTER — Ambulatory Visit: Payer: PPO | Admitting: Internal Medicine

## 2023-04-03 DIAGNOSIS — J0100 Acute maxillary sinusitis, unspecified: Secondary | ICD-10-CM | POA: Diagnosis not present

## 2023-04-03 DIAGNOSIS — J209 Acute bronchitis, unspecified: Secondary | ICD-10-CM | POA: Diagnosis not present

## 2023-04-12 ENCOUNTER — Ambulatory Visit: Payer: PPO | Admitting: Internal Medicine

## 2023-04-19 DIAGNOSIS — N130 Hydronephrosis with ureteropelvic junction obstruction: Secondary | ICD-10-CM | POA: Diagnosis not present

## 2023-04-19 DIAGNOSIS — R338 Other retention of urine: Secondary | ICD-10-CM | POA: Diagnosis not present

## 2023-04-19 DIAGNOSIS — N200 Calculus of kidney: Secondary | ICD-10-CM | POA: Diagnosis not present

## 2023-04-21 NOTE — Progress Notes (Unsigned)
Kent Hartman, male    DOB: 11/29/1950   MRN: [id number]   Brief patient profile:  16  yowm  never smoker with h/o seasonal rhinitis in his 77s otc  referred to pulmonary clinic 09/14/2022 by Renne Crigler for breathing problems assoc with choking sensations  lasting several minutes  since the age of 81     Last known exposure was 88 with known bilateral pleural plaques   History of Present Illness  09/14/2022  Pulmonary/ 1st office eval/Arie Gable  Chief Complaint  Patient presents with   Consult    Asthma  Dyspnea:  fast walk sometimes up to 3-4 miles some jogging on a flat surface mostly Cough: dry/ paroxysmal year round up to several times a week resolves s rx p few min at most  Sleep: no resp cc in recliner 30 degrees SABA use: no benefit Rec Pantoprazole (protonix) 40 mg   Take  30-60 min before first meal of the day and Pepcid (famotidine)  20 mg after supper until return to office   GERD diet reviewed, bed blocks rec   I will place you in reminder file for follow up in 6 months to get  the CT of chest > not done     04/22/2023  f/u ov/Rachael Zapanta re: LUL spn /uacs  maint on gerd rx/ no changes noted   Chief Complaint  Patient presents with   Follow-up    No change in sx.  Albuterol inhaler helps sx.  Dyspnea:  Not limited by breathing from desired activities  - does  Cough: spells summer and fall x over a decade and loses voice  Sleeping: sleeps in recliner 30 degree but does fine flat  SABA use: sev times a week  02: none      No obvious day to day or daytime variability or assoc excess/ purulent sputum or mucus plugs or hemoptysis or cp or chest tightness, subjective wheeze or overt sinus or hb symptoms.   *** without nocturnal  or early am exacerbation  of respiratory  c/o's or need for noct saba. Also denies any obvious fluctuation of symptoms with weather or environmental changes or other aggravating or alleviating factors except as outlined above   No unusual exposure hx or  h/o childhood pna/ asthma or knowledge of premature birth.  Current Allergies, Complete Past Medical History, Past Surgical History, Family History, and Social History were reviewed in [employer] record.  ROS  The following are not active complaints unless bolded Hoarseness, sore throat, dysphagia, dental problems, itching, sneezing,  nasal congestion or discharge of excess mucus or purulent secretions, ear ache,   fever, chills, sweats, unintended wt loss or wt gain, classically pleuritic or exertional cp,  orthopnea pnd or arm/hand swelling  or leg swelling, presyncope, palpitations, abdominal pain, anorexia, nausea, vomiting, diarrhea  or change in bowel habits or change in bladder habits, change in stools or change in urine, dysuria, hematuria,  rash, arthralgias, visual complaints, headache, numbness, weakness or ataxia or problems with walking or coordination,  change in mood or  memory.        Current Meds  Medication Sig   albuterol (VENTOLIN HFA) 108 (90 Base) MCG/ACT inhaler Inhale 1-2 puffs into the lungs every 6 (six) hours as needed for wheezing or shortness of breath.   Alpha-Lipoic Acid 600 MG CAPS Take by mouth.   aspirin EC 81 MG tablet Take 81 mg by mouth daily.   cetirizine (ZYRTEC) 10 MG tablet Take 10  mg by mouth daily.   Cholecalciferol (VITAMIN D-3 PO) Take by mouth.   diphenhydrAMINE (SOMINEX) 25 MG tablet Take 25 mg by mouth at bedtime as needed for sleep.   famotidine (PEPCID) 20 MG tablet One after supper   latanoprost (XALATAN) 0.005 % ophthalmic solution Place 1 drop into both eyes at bedtime.   losartan (COZAAR) 50 MG tablet Take 50 mg by mouth at bedtime.   Magnesium Hydroxide (DULCOLAX PO) Take by mouth.   Multiple Vitamins-Minerals (CENTRUM SILVER PO) Take by mouth.   pantoprazole (PROTONIX) 40 MG tablet TAKE 1 TABLET BY MOUTH ONCE DAILY 30-60  MINUTES  BEFORE  FIRST  MEAL  OF  THE  DAY   rosuvastatin (CRESTOR) 20 MG tablet Take 20 mg by  mouth at bedtime.                   Past Medical History:  Diagnosis Date   Coronary artery disease involving native coronary artery of native heart without angina pectoris    cardiologist--- dr Star Age;   nuclear stress test 10-15-2021  low risk normal perfusion, nuclear ef 61%;   CCTA score =720   Disorder of lipoprotein metabolism    Glaucoma, left eye    Hard of hearing    History of cellulitis 06/21/2015   w/ abscess LLE   History of methicillin resistant staphylococcus aureus (MRSA)    HLD (hyperlipidemia)    Hyperlipidemia    Insomnia    Left ureteral calculus    Nephrolithiasis    11/ 2023  small non-obs right side   Pleural plaque due to asbestos exposure    pulmonologist--- dr Chestine Spore Theron Arista 09-08-2019)   (bilateral , stable per at CT 08-19-2021  )   Pre-diabetes    Thoracic ascending aortic aneurysm [facility])    followed by cardiologist---  last echo 42mm       Objective:     Wt Readings from Last 3 Encounters:  04/22/23 241 lb 12.8 oz (109.7 kg)  09/14/22 248 lb (112.5 kg)  09/02/22 246 lb 11.2 oz (111.9 kg)     Vital signs reviewed  04/22/2023  - Note at rest 02 sats  ***% on ***   General appearance:    ***      I personally reviewed images and agree with radiology impression as follows:   Chest CT with contrast 09/14/2022      1. 4.0 cm dilatation of the ascending thoracic aorta, at the sinuses of Valsalva. Continue annual imaging followup by CTA or MRA. This recommendation follows 2010 ACCF/AHA/AATS/ACR/ASA/SCA/SCAI/SIR/STS/SVM Guidelines for the Diagnosis and Management of Patients with Thoracic Aortic Disease. Circulation. 2010; 121: Y865-H846. Aortic aneurysm NOS (ICD10-I71.9) 2. 6 mm LEFT part-solid pulmonary nodule within the upper lobe. Per Fleischner Society Guidelines, recommend a non-contrast Chest CT at 3-6 months to confirm persistence.   3. Sub 1 cm incidental RIGHT thyroid nodule. No follow-up imaging is recommended.       Assessment

## 2023-04-22 ENCOUNTER — Ambulatory Visit: Payer: PPO | Admitting: Internal Medicine

## 2023-04-22 ENCOUNTER — Encounter: Payer: Self-pay | Admitting: Internal Medicine

## 2023-04-22 VITALS — BP 120/74 | HR 84 | Temp 97.9°F | Ht 72.0 in | Wt 241.8 lb

## 2023-04-22 DIAGNOSIS — R058 Other specified cough: Secondary | ICD-10-CM | POA: Diagnosis not present

## 2023-04-22 DIAGNOSIS — R918 Other nonspecific abnormal finding of lung field: Secondary | ICD-10-CM | POA: Diagnosis not present

## 2023-04-22 DIAGNOSIS — R911 Solitary pulmonary nodule: Secondary | ICD-10-CM

## 2023-04-22 NOTE — Patient Instructions (Addendum)
1) stop pantoprazole 2) increase pepcid to 20 mg one twice daily after meals x one week (after breakfast and supper)  3) after one week if no cough reduce pepcid to 20 mg after supper x one more week and  then stop  If worse cough or acid symptoms go back one step and try the next step after 2 weeks with 2 weeks between each step down   My office will be contacting you by phone for referral to ENT and CT chest   - if you don't hear back from my office within one week please call us back or notify us thru MyChart and we'll address it right away.    Also  Ok to try albuterol 15 min before an activity (on alternating days)  that you know would usually make you short of breath and see if it makes any difference and if makes none then don't take albuterol after activity unless you can't catch your breath as this means it's the resting that helps, not the albuterol.   Please schedule a follow up visit in 3 months but call sooner if needed

## 2023-04-24 NOTE — Assessment & Plan Note (Addendum)
Never smoker CT 09/14/22 :6 mm LEFT part-solid pulmonary nodule within the upper lobe.   Stable appearance of bilateral calcified subpleural nodules - 04/22/2023 f/u CT chest ordered >>>  Discussed in detail all the  indications, usual  risks and alternatives  relative to the benefits with patient who agrees to proceed with w/u as outlined.         Each maintenance medication was reviewed in detail including emphasizing most importantly the difference between maintenance and prns and under what circumstances the prns are to be triggered using an action plan format where appropriate.  Total time for H and P, chart review, counseling, reviewing hfa device(s) and generating customized AVS unique to this office visit / same day charting = 30 min

## 2023-04-24 NOTE — Assessment & Plan Note (Addendum)
Onset age 72 with a background of seasonal rhinitis late summer early fall  - Allergy screen 09/14/2022 >  Eos 0.2/  IgE  29  - GERD rx 09/14/2022 >>> not effective as of 04/22/2023 so d/c and referred to ENT and ok to wean off gerd rx   Re saba use for asthma component:  Re SABA :  I spent extra time with pt today reviewing appropriate use of albuterol for prn use on exertion with the following points: 1) saba is for relief of sob that does not improve by walking a slower pace or resting but rather if the pt does not improve after trying this first. 2) If the pt is convinced, as many are, that saba helps recover from activity faster then it's easy to tell if this is the case by re-challenging : ie stop, take the inhaler, then p 5 minutes try the exact same activity (intensity of workload) that just caused the symptoms and see if they are substantially diminished or not after saba 3) if there is an activity that reproducibly causes the symptoms, try the saba 15 min before the activity on alternate days   If in fact the saba really does help, then fine to continue to use it prn but advised may need to look closer at the maintenance regimen (which is now 0)  being used to achieve better control of airways disease with exertion.   - The proper method of use, as well as anticipated side effects, of a metered-dose inhaler were discussed and demonstrated to the patient using teach back method.   F/u in 3 m

## 2023-05-04 ENCOUNTER — Ambulatory Visit (HOSPITAL_COMMUNITY)
Admission: RE | Admit: 2023-05-04 | Discharge: 2023-05-04 | Disposition: A | Discharge status: Home / Self Care | Payer: PPO | Source: Ambulatory Visit | Attending: Internal Medicine | Admitting: Internal Medicine

## 2023-05-04 DIAGNOSIS — E042 Nontoxic multinodular goiter: Secondary | ICD-10-CM | POA: Diagnosis not present

## 2023-05-04 DIAGNOSIS — R918 Other nonspecific abnormal finding of lung field: Secondary | ICD-10-CM | POA: Diagnosis not present

## 2023-05-04 DIAGNOSIS — R911 Solitary pulmonary nodule: Secondary | ICD-10-CM | POA: Diagnosis not present

## 2023-05-04 DIAGNOSIS — J929 Pleural plaque without asbestos: Secondary | ICD-10-CM | POA: Diagnosis not present

## 2023-06-01 DIAGNOSIS — H25812 Combined forms of age-related cataract, left eye: Secondary | ICD-10-CM | POA: Diagnosis not present

## 2023-06-01 DIAGNOSIS — H2511 Age-related nuclear cataract, right eye: Secondary | ICD-10-CM | POA: Diagnosis not present

## 2023-06-01 DIAGNOSIS — H43812 Vitreous degeneration, left eye: Secondary | ICD-10-CM | POA: Diagnosis not present

## 2023-06-01 DIAGNOSIS — H401122 Primary open-angle glaucoma, left eye, moderate stage: Secondary | ICD-10-CM | POA: Diagnosis not present

## 2023-07-12 ENCOUNTER — Ambulatory Visit: Payer: PPO | Attending: Cardiology | Admitting: Cardiology

## 2023-07-12 ENCOUNTER — Encounter: Payer: Self-pay | Admitting: Cardiology

## 2023-07-12 VITALS — BP 114/78 | HR 72 | Ht 72.0 in | Wt 249.6 lb

## 2023-07-12 DIAGNOSIS — R0600 Dyspnea, unspecified: Secondary | ICD-10-CM

## 2023-07-12 DIAGNOSIS — I7121 Aneurysm of the ascending aorta, without rupture: Secondary | ICD-10-CM

## 2023-07-12 DIAGNOSIS — I2510 Atherosclerotic heart disease of native coronary artery without angina pectoris: Secondary | ICD-10-CM

## 2023-07-12 NOTE — Progress Notes (Signed)
Cardiology Office Note:  .   Date:  07/12/2023  ID:  Kent Hartman, DOB Oct 25, 1950, MRN [id number] PCP: Merri Brunette, MD  [facility] Providers Cardiologist:  Donato Schultz, MD     History of Present Illness: .   Kent Hartman is a 72 y.o. male Discussed with the use of AI scribe software   History of Present Illness   A 72 year old patient with a history of coronary artery disease and an elevated coronary calcium score of 700, currently managed with Crestor 20mg  daily, presented for a follow-up visit. He had previously reported dyspnea, particularly when walking uphill. A CT scan had revealed an ascending aortic dilatation measuring 40mm.  In November of the previous year, he underwent a nuclear stress test, which showed low risk with no perfusion defects and a normal ejection fraction. An echocardiogram performed at the same time showed normal pump function, normal estimated right ventricular pressures, and no significant valvular lesions.  [name] medication regimen also includes aspirin 81mg  and Losartan 50mg  at bedtime. He reported no new symptoms or changes in his health status. However, he expressed concern about the aortic dilatation due to a family history of aortic aneurysm.  The patient maintains an active lifestyle, walking three to four times a week for at least three miles. He reported occasional fatigue, particularly the day after a long walk. Despite this, the patient's overall condition appears stable with no escalation of symptoms.  Wife present.      Studies Reviewed: Marland Kitchen   EKG Interpretation Date/Time:  Monday July 12 2023 08:04:24 EDT Ventricular Rate:  72 PR Interval:  182 QRS Duration:  84 QT Interval:  382 QTC Calculation: 418 R Axis:   -16  Text Interpretation: Normal sinus rhythm Normal ECG When compared with ECG of 15-Oct-2021 09:02, No significant change since last tracing Confirmed by Donato Schultz ([id number]) on 07/12/2023 8:14:45 AM     RADIOLOGY Coronary calcium score: 700 (2023) CT scan: Ascending aortic dilatation at 40 mm (2023)  DIAGNOSTIC Nuclear stress test: Low risk with no perfusion defects and normal ejection fraction (10/15/2021) Echocardiogram: Normal pump function with normal estimated right ventricular pressures. No significant valvular lesions (10/15/2021) EKG: Normal (07/12/2023)  Risk Assessment/Calculations:            Physical Exam:   VS:  BP 114/78   Pulse 72   Ht 6' (1.829 m)   Wt 249 lb 9.6 oz (113.2 kg)   SpO2 96%   BMI 33.85 kg/m    Wt Readings from Last 3 Encounters:  07/12/23 249 lb 9.6 oz (113.2 kg)  04/22/23 241 lb 12.8 oz (109.7 kg)  09/14/22 248 lb (112.5 kg)    GEN: Well nourished, well developed in no acute distress NECK: No JVD; No carotid bruits CARDIAC: RRR, no murmurs, no rubs, no gallops RESPIRATORY:  Clear to auscultation without rales, wheezing or rhonchi  ABDOMEN: Soft, non-tender, non-distended EXTREMITIES:  No edema; No deformity   ASSESSMENT AND PLAN: .    Assessment and Plan    Coronary Artery Disease Elevated coronary calcium score of 700. No perfusion defects on nuclear stress test NormL EF. performed on 10/15/2021. LDL goal less than 70 on Crestor 20mg  daily. -Continue Crestor 20mg  daily and Aspirin 81mg  daily. -Continue monitoring symptoms and report any changes.  Ascending Aortic Dilatation Mild dilatation at 40mm seen on CT scan. -Order echocardiogram to reassess size of dilatation.  Hypertension Well controlled on Losartan 50mg  at bedtime. -Continue Losartan 50mg  at bedtime.  General Health Maintenance -Annual lab work scheduled for 08/04/2023 at outside office. -One year follow-up appointment scheduled.              Signed, Donato Schultz, MD

## 2023-07-12 NOTE — Patient Instructions (Signed)
Medication Instructions:  The current medical regimen is effective;  continue present plan and medications.  *If you need a refill on your cardiac medications before your next appointment, please call your pharmacy*  Testing/Procedures: Your physician has requested that you have an echocardiogram. Echocardiography is a painless test that uses sound waves to create images of your heart. It provides your doctor with information about the size and shape of your heart and how well your heart's chambers and valves are working. This procedure takes approximately one hour. There are no restrictions for this procedure. Please do NOT wear cologne, perfume, aftershave, or lotions (deodorant is allowed). Please arrive 15 minutes prior to your appointment time.  Follow-Up: At [facility], you and your health needs are our priority.  As part of our continuing mission to provide you with exceptional heart care, we have created designated Provider Care Teams.  These Care Teams include your primary Cardiologist (physician) and Advanced Practice Providers (APPs -  Physician Assistants and Nurse Practitioners) who all work together to provide you with the care you need, when you need it.  We recommend signing up for the patient portal called "MyChart".  Sign up information is provided on this After Visit Summary.  MyChart is used to connect with patients for Virtual Visits (Telemedicine).  Patients are able to view lab/test results, encounter notes, upcoming appointments, etc.  Non-urgent messages can be sent to your provider as well.   To learn more about what you can do with MyChart, go to [email or web address].    Your next appointment:   1 year(s)  Provider:   Dr Mark Skains     

## 2023-07-27 ENCOUNTER — Ambulatory Visit (HOSPITAL_COMMUNITY): Payer: PPO | Attending: Internal Medicine

## 2023-07-27 DIAGNOSIS — R0600 Dyspnea, unspecified: Secondary | ICD-10-CM | POA: Insufficient documentation

## 2023-07-27 DIAGNOSIS — I2510 Atherosclerotic heart disease of native coronary artery without angina pectoris: Secondary | ICD-10-CM | POA: Diagnosis not present

## 2023-07-27 LAB — ECHOCARDIOGRAM COMPLETE
Area-P 1/2: 3.48 cm2
S' Lateral: 2.6 cm

## 2023-08-02 ENCOUNTER — Other Ambulatory Visit: Payer: Self-pay | Admitting: *Deleted

## 2023-08-02 DIAGNOSIS — I7121 Aneurysm of the ascending aorta, without rupture: Secondary | ICD-10-CM

## 2023-08-04 DIAGNOSIS — E789 Disorder of lipoprotein metabolism, unspecified: Secondary | ICD-10-CM | POA: Diagnosis not present

## 2023-08-04 DIAGNOSIS — Z125 Encounter for screening for malignant neoplasm of prostate: Secondary | ICD-10-CM | POA: Diagnosis not present

## 2023-08-04 DIAGNOSIS — R739 Hyperglycemia, unspecified: Secondary | ICD-10-CM | POA: Diagnosis not present

## 2023-08-09 DIAGNOSIS — J4520 Mild intermittent asthma, uncomplicated: Secondary | ICD-10-CM | POA: Diagnosis not present

## 2023-08-09 DIAGNOSIS — E1122 Type 2 diabetes mellitus with diabetic chronic kidney disease: Secondary | ICD-10-CM | POA: Diagnosis not present

## 2023-08-09 DIAGNOSIS — I700 Atherosclerosis of aorta: Secondary | ICD-10-CM | POA: Diagnosis not present

## 2023-08-09 DIAGNOSIS — R911 Solitary pulmonary nodule: Secondary | ICD-10-CM | POA: Diagnosis not present

## 2023-08-09 DIAGNOSIS — N1831 Chronic kidney disease, stage 3a: Secondary | ICD-10-CM | POA: Diagnosis not present

## 2023-08-09 DIAGNOSIS — I10 Essential (primary) hypertension: Secondary | ICD-10-CM | POA: Diagnosis not present

## 2023-08-09 DIAGNOSIS — N529 Male erectile dysfunction, unspecified: Secondary | ICD-10-CM | POA: Diagnosis not present

## 2023-08-09 DIAGNOSIS — Z0000 Encounter for general adult medical examination without abnormal findings: Secondary | ICD-10-CM | POA: Diagnosis not present

## 2023-08-09 DIAGNOSIS — Z23 Encounter for immunization: Secondary | ICD-10-CM | POA: Diagnosis not present

## 2023-08-09 DIAGNOSIS — I2510 Atherosclerotic heart disease of native coronary artery without angina pectoris: Secondary | ICD-10-CM | POA: Diagnosis not present

## 2023-08-09 DIAGNOSIS — R7401 Elevation of levels of liver transaminase levels: Secondary | ICD-10-CM | POA: Diagnosis not present

## 2023-08-09 DIAGNOSIS — I7121 Aneurysm of the ascending aorta, without rupture: Secondary | ICD-10-CM | POA: Diagnosis not present

## 2023-08-17 DIAGNOSIS — I2510 Atherosclerotic heart disease of native coronary artery without angina pectoris: Secondary | ICD-10-CM | POA: Diagnosis not present

## 2023-08-17 DIAGNOSIS — E1122 Type 2 diabetes mellitus with diabetic chronic kidney disease: Secondary | ICD-10-CM | POA: Diagnosis not present

## 2023-08-17 DIAGNOSIS — I77810 Thoracic aortic ectasia: Secondary | ICD-10-CM | POA: Diagnosis not present

## 2023-08-17 DIAGNOSIS — I700 Atherosclerosis of aorta: Secondary | ICD-10-CM | POA: Diagnosis not present

## 2023-08-17 DIAGNOSIS — E789 Disorder of lipoprotein metabolism, unspecified: Secondary | ICD-10-CM | POA: Diagnosis not present

## 2023-08-17 DIAGNOSIS — I10 Essential (primary) hypertension: Secondary | ICD-10-CM | POA: Diagnosis not present

## 2023-09-13 ENCOUNTER — Other Ambulatory Visit: Payer: Self-pay | Admitting: Gastroenterology

## 2023-09-13 DIAGNOSIS — R7989 Other specified abnormal findings of blood chemistry: Secondary | ICD-10-CM | POA: Diagnosis not present

## 2023-09-13 DIAGNOSIS — Z860101 Personal history of adenomatous and serrated colon polyps: Secondary | ICD-10-CM | POA: Diagnosis not present

## 2023-09-13 DIAGNOSIS — R945 Abnormal results of liver function studies: Secondary | ICD-10-CM | POA: Diagnosis not present

## 2023-09-17 ENCOUNTER — Ambulatory Visit
Admission: RE | Admit: 2023-09-17 | Discharge: 2023-09-17 | Disposition: A | Discharge status: Home / Self Care | Payer: PPO | Source: Ambulatory Visit | Attending: Gastroenterology

## 2023-09-17 DIAGNOSIS — R7989 Other specified abnormal findings of blood chemistry: Secondary | ICD-10-CM

## 2023-10-02 DIAGNOSIS — J069 Acute upper respiratory infection, unspecified: Secondary | ICD-10-CM | POA: Diagnosis not present

## 2023-10-02 DIAGNOSIS — R0981 Nasal congestion: Secondary | ICD-10-CM | POA: Diagnosis not present

## 2023-10-02 DIAGNOSIS — R051 Acute cough: Secondary | ICD-10-CM | POA: Diagnosis not present

## 2023-10-20 DIAGNOSIS — K648 Other hemorrhoids: Secondary | ICD-10-CM | POA: Diagnosis not present

## 2023-10-20 DIAGNOSIS — Z860101 Personal history of adenomatous and serrated colon polyps: Secondary | ICD-10-CM | POA: Diagnosis not present

## 2023-10-20 DIAGNOSIS — Z09 Encounter for follow-up examination after completed treatment for conditions other than malignant neoplasm: Secondary | ICD-10-CM | POA: Diagnosis not present

## 2023-10-20 DIAGNOSIS — D125 Benign neoplasm of sigmoid colon: Secondary | ICD-10-CM | POA: Diagnosis not present

## 2023-10-20 DIAGNOSIS — D124 Benign neoplasm of descending colon: Secondary | ICD-10-CM | POA: Diagnosis not present

## 2023-10-26 DIAGNOSIS — D124 Benign neoplasm of descending colon: Secondary | ICD-10-CM | POA: Diagnosis not present

## 2023-10-26 DIAGNOSIS — D125 Benign neoplasm of sigmoid colon: Secondary | ICD-10-CM | POA: Diagnosis not present

## 2023-11-11 DIAGNOSIS — H43812 Vitreous degeneration, left eye: Secondary | ICD-10-CM | POA: Diagnosis not present

## 2023-11-11 DIAGNOSIS — H25812 Combined forms of age-related cataract, left eye: Secondary | ICD-10-CM | POA: Diagnosis not present

## 2023-11-11 DIAGNOSIS — H401122 Primary open-angle glaucoma, left eye, moderate stage: Secondary | ICD-10-CM | POA: Diagnosis not present

## 2023-11-11 DIAGNOSIS — H2511 Age-related nuclear cataract, right eye: Secondary | ICD-10-CM | POA: Diagnosis not present

## 2023-11-18 DIAGNOSIS — I10 Essential (primary) hypertension: Secondary | ICD-10-CM | POA: Diagnosis not present

## 2023-11-18 DIAGNOSIS — I700 Atherosclerosis of aorta: Secondary | ICD-10-CM | POA: Diagnosis not present

## 2023-11-18 DIAGNOSIS — E789 Disorder of lipoprotein metabolism, unspecified: Secondary | ICD-10-CM | POA: Diagnosis not present

## 2023-11-18 DIAGNOSIS — I77810 Thoracic aortic ectasia: Secondary | ICD-10-CM | POA: Diagnosis not present

## 2023-11-18 DIAGNOSIS — I2510 Atherosclerotic heart disease of native coronary artery without angina pectoris: Secondary | ICD-10-CM | POA: Diagnosis not present

## 2023-11-18 DIAGNOSIS — E1122 Type 2 diabetes mellitus with diabetic chronic kidney disease: Secondary | ICD-10-CM | POA: Diagnosis not present

## 2023-12-07 DIAGNOSIS — R7989 Other specified abnormal findings of blood chemistry: Secondary | ICD-10-CM | POA: Diagnosis not present

## 2023-12-07 DIAGNOSIS — Z860101 Personal history of adenomatous and serrated colon polyps: Secondary | ICD-10-CM | POA: Diagnosis not present

## 2023-12-07 DIAGNOSIS — R945 Abnormal results of liver function studies: Secondary | ICD-10-CM | POA: Diagnosis not present

## 2024-03-21 DIAGNOSIS — I700 Atherosclerosis of aorta: Secondary | ICD-10-CM | POA: Diagnosis not present

## 2024-03-21 DIAGNOSIS — E1122 Type 2 diabetes mellitus with diabetic chronic kidney disease: Secondary | ICD-10-CM | POA: Diagnosis not present

## 2024-03-21 DIAGNOSIS — I10 Essential (primary) hypertension: Secondary | ICD-10-CM | POA: Diagnosis not present

## 2024-03-21 DIAGNOSIS — E789 Disorder of lipoprotein metabolism, unspecified: Secondary | ICD-10-CM | POA: Diagnosis not present

## 2024-03-30 DIAGNOSIS — I2510 Atherosclerotic heart disease of native coronary artery without angina pectoris: Secondary | ICD-10-CM | POA: Diagnosis not present

## 2024-03-30 DIAGNOSIS — I10 Essential (primary) hypertension: Secondary | ICD-10-CM | POA: Diagnosis not present

## 2024-03-30 DIAGNOSIS — E789 Disorder of lipoprotein metabolism, unspecified: Secondary | ICD-10-CM | POA: Diagnosis not present

## 2024-03-30 DIAGNOSIS — E1122 Type 2 diabetes mellitus with diabetic chronic kidney disease: Secondary | ICD-10-CM | POA: Diagnosis not present

## 2024-03-30 DIAGNOSIS — R7989 Other specified abnormal findings of blood chemistry: Secondary | ICD-10-CM | POA: Diagnosis not present

## 2024-05-23 DIAGNOSIS — N200 Calculus of kidney: Secondary | ICD-10-CM | POA: Diagnosis not present

## 2024-06-08 DIAGNOSIS — R7989 Other specified abnormal findings of blood chemistry: Secondary | ICD-10-CM | POA: Diagnosis not present

## 2024-06-08 DIAGNOSIS — K5900 Constipation, unspecified: Secondary | ICD-10-CM | POA: Diagnosis not present

## 2024-06-08 DIAGNOSIS — R7401 Elevation of levels of liver transaminase levels: Secondary | ICD-10-CM | POA: Diagnosis not present

## 2024-06-29 DIAGNOSIS — E789 Disorder of lipoprotein metabolism, unspecified: Secondary | ICD-10-CM | POA: Diagnosis not present

## 2024-06-29 DIAGNOSIS — E1122 Type 2 diabetes mellitus with diabetic chronic kidney disease: Secondary | ICD-10-CM | POA: Diagnosis not present

## 2024-06-29 DIAGNOSIS — R7989 Other specified abnormal findings of blood chemistry: Secondary | ICD-10-CM | POA: Diagnosis not present

## 2024-06-29 DIAGNOSIS — I10 Essential (primary) hypertension: Secondary | ICD-10-CM | POA: Diagnosis not present

## 2024-06-29 DIAGNOSIS — I2510 Atherosclerotic heart disease of native coronary artery without angina pectoris: Secondary | ICD-10-CM | POA: Diagnosis not present

## 2024-07-06 ENCOUNTER — Ambulatory Visit (HOSPITAL_COMMUNITY)
Admission: RE | Admit: 2024-07-06 | Discharge: 2024-07-06 | Disposition: A | Discharge status: Home / Self Care | Source: Ambulatory Visit | Attending: Cardiology | Admitting: Cardiology

## 2024-07-06 DIAGNOSIS — I7121 Aneurysm of the ascending aorta, without rupture: Secondary | ICD-10-CM | POA: Insufficient documentation

## 2024-07-06 LAB — ECHOCARDIOGRAM COMPLETE
Area-P 1/2: 1.58 cm2
S' Lateral: 2.6 cm

## 2024-07-11 ENCOUNTER — Ambulatory Visit (HOSPITAL_COMMUNITY)

## 2024-07-11 ENCOUNTER — Ambulatory Visit: Payer: Self-pay | Admitting: Cardiology

## 2024-08-10 DIAGNOSIS — Z125 Encounter for screening for malignant neoplasm of prostate: Secondary | ICD-10-CM | POA: Diagnosis not present

## 2024-08-10 DIAGNOSIS — I10 Essential (primary) hypertension: Secondary | ICD-10-CM | POA: Diagnosis not present

## 2024-08-15 ENCOUNTER — Other Ambulatory Visit: Payer: Self-pay | Admitting: Internal Medicine

## 2024-08-15 DIAGNOSIS — J4520 Mild intermittent asthma, uncomplicated: Secondary | ICD-10-CM | POA: Diagnosis not present

## 2024-08-15 DIAGNOSIS — E118 Type 2 diabetes mellitus with unspecified complications: Secondary | ICD-10-CM | POA: Diagnosis not present

## 2024-08-15 DIAGNOSIS — I2510 Atherosclerotic heart disease of native coronary artery without angina pectoris: Secondary | ICD-10-CM | POA: Diagnosis not present

## 2024-08-15 DIAGNOSIS — Z23 Encounter for immunization: Secondary | ICD-10-CM | POA: Diagnosis not present

## 2024-08-15 DIAGNOSIS — Z0000 Encounter for general adult medical examination without abnormal findings: Secondary | ICD-10-CM | POA: Diagnosis not present

## 2024-08-15 DIAGNOSIS — I700 Atherosclerosis of aorta: Secondary | ICD-10-CM | POA: Diagnosis not present

## 2024-08-15 DIAGNOSIS — I77810 Thoracic aortic ectasia: Secondary | ICD-10-CM | POA: Diagnosis not present

## 2024-08-15 DIAGNOSIS — R911 Solitary pulmonary nodule: Secondary | ICD-10-CM

## 2024-08-15 DIAGNOSIS — I10 Essential (primary) hypertension: Secondary | ICD-10-CM | POA: Diagnosis not present

## 2024-08-15 DIAGNOSIS — K5900 Constipation, unspecified: Secondary | ICD-10-CM | POA: Diagnosis not present

## 2024-08-15 DIAGNOSIS — N200 Calculus of kidney: Secondary | ICD-10-CM | POA: Diagnosis not present

## 2024-08-15 DIAGNOSIS — J929 Pleural plaque without asbestos: Secondary | ICD-10-CM | POA: Diagnosis not present

## 2024-08-22 ENCOUNTER — Ambulatory Visit
Admission: RE | Admit: 2024-08-22 | Discharge: 2024-08-22 | Disposition: A | Source: Ambulatory Visit | Attending: Internal Medicine | Admitting: Internal Medicine

## 2024-08-22 DIAGNOSIS — R911 Solitary pulmonary nodule: Secondary | ICD-10-CM

## 2024-10-16 ENCOUNTER — Ambulatory Visit

## 2024-10-16 VITALS — BP 111/76 | HR 85 | Ht 72.0 in | Wt 235.0 lb

## 2024-10-16 DIAGNOSIS — R911 Solitary pulmonary nodule: Secondary | ICD-10-CM

## 2024-10-16 NOTE — Patient Instructions (Signed)
" °  VISIT SUMMARY: Today, you came in for a follow-up appointment regarding a stable nodule in your left lung. This nodule was first identified in 2023 and has remained stable in size through subsequent CT scans. 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 You also mentioned experiencing seasonal rhinitis, choking, and a globus sensation for several years, but these symptoms have not significantly impacted your daily activities.  YOUR PLAN: -LEFT UPPER LOBE PULMONARY NODULE: A pulmonary nodule is a small, round growth in the lung. Your 6 mm nodule has been stable since 2023, and there is no current concern for cancer. Given your possible asbestos exposure, we will continue to monitor it. You will have a follow-up CT scan in May 2026. If the nodule increases in size, we may consider a bronchoscopy and biopsy to further investigate. Please schedule a follow-up appointment after your CT scan, in late May or early June.  INSTRUCTIONS: Please schedule a follow-up CT scan for May 2026 and a follow-up appointment with us  in late May or early June to discuss the results.                         Contains text generated by [employer].   "

## 2024-10-16 NOTE — Progress Notes (Signed)
 "   Subjective:   PATIENT ID: Kent Hartman GENDER: male DOB: [birth year], MRN: [id number]   HPI Discussed the use of AI scribe software for clinical note transcription with the patient, who gave verbal consent to proceed.  History of Present Illness Kent Hartman is a 74 year old male who presents for follow-up of a stable left lung nodule. He was referred by Doctor Almer for follow-up of a lung nodule.  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 Although he denies any known asbestos exposure, he acknowledges the possibility due to his work history.  A six millimeter nodule was identified on his left upper lobe on a CT chest in 2023. Follow-up CT scans in August 2024 and November 2025 showed a relatively stable nodule. He is here for follow-up of this nodule.  He has a history of seasonal rhinitis and has experienced choking and a globus sensation for the past several years, starting around age 67. He has been coughing since then and has tried cough medicine and an inhaler, which provided temporary relief. The symptoms do not impede his activities, as he is able to perform tasks like cutting wood.  No fevers, chills, hemoptysis, recent infections, or unintentional weight loss. His appetite is good, and he has not traveled recently due to a canceled trip to [place].     Past Medical History:  Diagnosis Date   Coronary artery disease involving native coronary artery of native heart without angina pectoris    cardiologist--- dr marceil;   nuclear stress test 10-15-2021  low risk normal perfusion, nuclear ef 61%;   CCTA score =720   Disorder of lipoprotein metabolism    Glaucoma, left eye    Hard of hearing    History of cellulitis 06/21/2015   w/ abscess LLE   History of methicillin resistant staphylococcus aureus (MRSA)    HLD (hyperlipidemia)     Hyperlipidemia    Insomnia    Left ureteral calculus    Nephrolithiasis    11/ 2023  small non-obs right side   Pleural plaque due to asbestos exposure    pulmonologist--- dr gretta graciela 09-08-2019)   (bilateral , stable per at CT 08-19-2021  )   Pre-diabetes    Thoracic ascending aortic aneurysm    followed by cardiologist---  last echo 42mm     Family History  Problem Relation Age of Onset   Rheum arthritis Mother    Breast cancer Sister    Pancreatic cancer Brother      Social History   Socioeconomic History   Marital status: Married    Spouse name: Not on file   Number of children: Not on file   Years of education: Not on file   Highest education level: Not on file  Occupational History   Not on file  Tobacco Use   Smoking status: Never   Smokeless tobacco: Never  Vaping Use   Vaping status: Never Used  Substance and Sexual Activity   Alcohol use: No   Drug use: Never   Sexual activity: Not on file  Other Topics Concern   Not on file  Social History Narrative   Not on file   Social Drivers of Health   Tobacco Use: Low Risk (10/16/2024)   Patient History    Smoking Tobacco Use: Never    Smokeless Tobacco Use: Never  Passive Exposure: Not on file  Financial Resource Strain: Not on file  Food Insecurity: Not on file  Transportation Needs: Not on file  Physical Activity: Not on file  Stress: Not on file  Social Connections: Not on file  Intimate Partner Violence: Not on file  Depression (EYV7-0): Not on file  Alcohol Screen: Not on file  Housing: Not on file  Utilities: Not on file  Health Literacy: Not on file     Allergies[1]   Outpatient Medications Prior to Visit  Medication Sig Dispense Refill   albuterol (VENTOLIN HFA) 108 (90 Base) MCG/ACT inhaler Inhale 1-2 puffs into the lungs every 6 (six) hours as needed for wheezing or shortness of breath.     aspirin EC 81 MG tablet Take 81 mg by mouth daily.     Cholecalciferol (VITAMIN D-3 PO) Take  by mouth.     latanoprost (XALATAN) 0.005 % ophthalmic solution Place 1 drop into both eyes at bedtime.     losartan (COZAAR) 50 MG tablet Take 50 mg by mouth at bedtime.     Multiple Vitamins-Minerals (CENTRUM SILVER PO) Take by mouth.     OZEMPIC, 2 MG/DOSE, 8 MG/3ML SOPN Inject 2 mg into the skin once a week.     rosuvastatin (CRESTOR) 20 MG tablet Take 20 mg by mouth at bedtime.     Facility-Administered Medications Prior to Visit  Medication Dose Route Frequency Provider Last Rate Last Admin   regadenoson  (LEXISCAN ) injection SOLN 0.4 mg  0.4 mg Intravenous Once O'Neal, [place] Thomas, MD        ROS Reviewed all systems and reported negative except as above     Objective:   Vitals:   10/16/24 1553  BP: 111/76  Pulse: 85  SpO2: 96%  Weight: 235 lb (106.6 kg)  Height: 6' (1.829 m)    Physical Exam Physical Exam GENERAL: Appropriate to age, no acute distress. HEAD EYES EARS NOSE THROAT: Moist mucous membranes, atraumatic, normocephalic. CHEST: Clear to auscultation bilaterally, no wheezing, no crackles, no rales CARDIAC: Regular rate and rhythm, normal S1, normal S2, no murmurs, no rubs, no gallops. ABDOMEN: Soft, nontender. NEUROLOGICAL: Motor and sensation grossly intact, alert and oriented times X 3. EXTREMITIES: Warm, well perfused, no edema.     CBC    Component Value Date/Time   WBC 6.7 09/14/2022 1028   RBC 4.40 09/14/2022 1028   HGB 13.8 09/14/2022 1028   HCT 40.5 09/14/2022 1028   PLT 181.0 09/14/2022 1028   MCV 92.1 09/14/2022 1028   MCH 31.0 09/02/2022 1801   MCHC 34.1 09/14/2022 1028   RDW 13.8 09/14/2022 1028   LYMPHSABS 1.7 09/14/2022 1028   MONOABS 0.6 09/14/2022 1028   EOSABS 0.2 09/14/2022 1028   BASOSABS 0.1 09/14/2022 1028     Results Radiology Chest CT (08/2024): Stable 6 mm left upper lobe pulmonary nodule, pleural calcifications, pleural plaques. Findings unchanged from 05/2023 and 2023.   PFT:     No data to display                Assessment & Plan:   Assessment and Plan Assessment & Plan Left upper lobe pulmonary nodule 6 mm nodule stable since 2023. No malignancy concern. Possible asbestos exposure, no lung function impact. - Follow-up CT scan May 2026. - Consider bronchoscopy and biopsy if nodule increases. - Schedule follow-up appointment post-CT, late May or early June.        Zola Herter, MD [facility] Office: [phone]       [  1] No Known Allergies  "

## 2025-01-22 ENCOUNTER — Ambulatory Visit: Admitting: Cardiology

## 2025-02-14 ENCOUNTER — Other Ambulatory Visit

## 2025-02-19 ENCOUNTER — Ambulatory Visit

## 2025-02-23 ENCOUNTER — Other Ambulatory Visit
